# Patient Record
Sex: Female | Born: 1970
Health system: Southern US, Community
[De-identification: ages and names within clinical notes are randomized; demographics above are authoritative.]

## PROBLEM LIST (undated history)

## (undated) DIAGNOSIS — E221 Hyperprolactinemia: Secondary | ICD-10-CM

## (undated) DIAGNOSIS — R51 Headache: Secondary | ICD-10-CM

## (undated) DIAGNOSIS — G5603 Carpal tunnel syndrome, bilateral upper limbs: Secondary | ICD-10-CM

## (undated) DIAGNOSIS — F419 Anxiety disorder, unspecified: Secondary | ICD-10-CM

## (undated) HISTORY — DX: Anxiety disorder, unspecified: F41.9

## (undated) HISTORY — DX: Carpal tunnel syndrome, bilateral upper limbs: G56.03

## (undated) HISTORY — PX: NASAL SINUS SURGERY: SHX719

## (undated) HISTORY — DX: Hyperprolactinemia: E22.1

---

## 1999-11-03 ENCOUNTER — Other Ambulatory Visit: Admission: RE | Admit: 1999-11-03 | Discharge: 1999-11-03 | Payer: Self-pay | Admitting: Gynecology

## 2000-08-15 ENCOUNTER — Encounter: Admission: RE | Admit: 2000-08-15 | Discharge: 2000-08-15 | Payer: Self-pay | Admitting: Otolaryngology

## 2000-08-15 ENCOUNTER — Encounter: Payer: Self-pay | Admitting: Otolaryngology

## 2000-11-28 ENCOUNTER — Other Ambulatory Visit: Admission: RE | Admit: 2000-11-28 | Discharge: 2000-11-28 | Payer: Self-pay | Admitting: Gynecology

## 2000-12-19 ENCOUNTER — Encounter: Admission: RE | Admit: 2000-12-19 | Discharge: 2001-03-19 | Payer: Self-pay | Admitting: Gynecology

## 2001-02-28 ENCOUNTER — Other Ambulatory Visit: Admission: RE | Admit: 2001-02-28 | Discharge: 2001-02-28 | Payer: Self-pay | Admitting: *Deleted

## 2001-06-05 ENCOUNTER — Observation Stay (HOSPITAL_COMMUNITY): Admission: AD | Admit: 2001-06-05 | Discharge: 2001-06-06 | Payer: Self-pay | Admitting: Gynecology

## 2001-06-26 ENCOUNTER — Inpatient Hospital Stay (HOSPITAL_COMMUNITY): Admission: AD | Admit: 2001-06-26 | Discharge: 2001-06-28 | Payer: Self-pay | Admitting: Gynecology

## 2001-06-29 ENCOUNTER — Encounter: Admission: RE | Admit: 2001-06-29 | Discharge: 2001-07-29 | Payer: Self-pay | Admitting: Gynecology

## 2001-08-05 ENCOUNTER — Other Ambulatory Visit: Admission: RE | Admit: 2001-08-05 | Discharge: 2001-08-05 | Payer: Self-pay | Admitting: Gynecology

## 2002-09-25 ENCOUNTER — Other Ambulatory Visit: Admission: RE | Admit: 2002-09-25 | Discharge: 2002-09-25 | Payer: Self-pay | Admitting: Gynecology

## 2003-06-24 ENCOUNTER — Other Ambulatory Visit: Admission: RE | Admit: 2003-06-24 | Discharge: 2003-06-24 | Payer: Self-pay | Admitting: Internal Medicine

## 2004-01-10 ENCOUNTER — Inpatient Hospital Stay (HOSPITAL_COMMUNITY): Admission: AD | Admit: 2004-01-10 | Discharge: 2004-01-12 | Payer: Self-pay | Admitting: Gynecology

## 2004-02-18 ENCOUNTER — Other Ambulatory Visit: Admission: RE | Admit: 2004-02-18 | Discharge: 2004-02-18 | Payer: Self-pay | Admitting: Gynecology

## 2005-03-09 ENCOUNTER — Other Ambulatory Visit: Admission: RE | Admit: 2005-03-09 | Discharge: 2005-03-09 | Payer: Self-pay | Admitting: Gynecology

## 2005-08-02 ENCOUNTER — Other Ambulatory Visit: Admission: RE | Admit: 2005-08-02 | Discharge: 2005-08-02 | Payer: Self-pay | Admitting: Gynecology

## 2006-03-26 ENCOUNTER — Other Ambulatory Visit: Admission: RE | Admit: 2006-03-26 | Discharge: 2006-03-26 | Payer: Self-pay | Admitting: Gynecology

## 2007-03-31 ENCOUNTER — Other Ambulatory Visit: Admission: RE | Admit: 2007-03-31 | Discharge: 2007-03-31 | Payer: Self-pay | Admitting: Gynecology

## 2008-04-01 ENCOUNTER — Other Ambulatory Visit: Admission: RE | Admit: 2008-04-01 | Discharge: 2008-04-01 | Payer: Self-pay | Admitting: Gynecology

## 2008-08-23 ENCOUNTER — Ambulatory Visit: Payer: Self-pay | Admitting: Gynecology

## 2008-10-19 ENCOUNTER — Ambulatory Visit: Payer: Self-pay | Admitting: Gynecology

## 2008-10-25 ENCOUNTER — Ambulatory Visit: Payer: Self-pay | Admitting: Gynecology

## 2008-11-22 ENCOUNTER — Ambulatory Visit: Payer: Self-pay | Admitting: Gynecology

## 2009-09-28 ENCOUNTER — Encounter: Admission: RE | Admit: 2009-09-28 | Discharge: 2009-09-28 | Payer: Self-pay | Admitting: Obstetrics and Gynecology

## 2010-09-24 ENCOUNTER — Encounter: Payer: Self-pay | Admitting: Gynecology

## 2010-09-25 ENCOUNTER — Encounter: Payer: Self-pay | Admitting: Obstetrics and Gynecology

## 2011-01-19 NOTE — Discharge Summary (Signed)
[facility]  Patient:    Karina Ramirez, Karina Ramirez Visit Number: [id number] MRN: [id number]          Service Type: OBS Location: 910A 9147 01 Attending Physician:  Tonye Royalty Dictated by:   Antony Contras, [facility] Admit Date:  06/26/2001 Discharge Date: 06/28/2001                             Discharge Summary  DISCHARGE DIAGNOSES:          1. Intrauterine pregnancy at term.                               2. Spontaneous onset of labor.                               3. Decreased maternal expulsive efforts.  PROCEDURES:                   Low forceps-assisted vaginal delivery of viable infant over second-degree midline episiotomy.  HISTORY OF PRESENT ILLNESS:   The patient is a 40 year old primigravida with an LMP of September 18, 2000, [facility] June 25, 2001.  Prenatal course was complicated by early placenta previa, which did resolve.  PRENATAL LABORATORY DATA:     Blood type O positive, antibody screen negative. RPR, HBsAg, HIV nonreactive.  MSAFP normal.  GBS was also positive.  HOSPITAL COURSE:              The patient presented with spontaneous onset of labor and rupture of membranes at 40-1/2 weeks.  Cervix was 4 cm, completely effaced, -1 station.  The patient was given PCP prophylaxis and did progress to complete dilatation.  She did experience some difficulty with expulsive efforts in the second stage of labor and requested assistance.  Delivery was accomplished per Dr. Douglass Rivers with low forceps.  The patient was delivered of an Apgars of 9 and 9, 6-pound 15-ounce female infant over a second-degree midline episiotomy.  Findings included a short umbilical cord. Postpartum course:  The patient remained afebrile, had no difficulty voiding, and was able to be discharged on her second postpartum day in satisfactory condition.  CBC:  Hematocrit 28.4, hemoglobin 9.8, WBC 16.6, platelets 261.  FOLLOW-UP:                    The patient is to  follow up in the office.  MEDICATIONS:                  Continue with prenatal vitamins and iron. Motrin or Tylox for pain. Dictated by:   Antony Contras, [facility] Attending Physician:  Tonye Royalty DD:  07/11/01 TD:  07/13/01 Job: [id number]

## 2011-01-19 NOTE — H&P (Signed)
[facility]  Patient:    Karina Ramirez, Karina Ramirez Visit Number: [id number] MRN: [id number]          Service Type: OBS Location: 910B 9163 01 Attending Physician:  Tonye Royalty Dictated by:   Gaetano Hawthorne. Lily Peer, M.D. Admit Date:  06/26/2001                           History and Physical  CHIEF COMPLAINT:              Rupture of membranes and labor.  HISTORY:                      Patient is a 40 year old gravida 1, para 0, with a last menstrual period September 18, 2000, estimated date of confinement confirmed as June 25, 2001, currently 40-1/[redacted] weeks gestation, who presented to [facility] this morning complaining of rupture of membranes that started earlier this evening after intercourse and had a bloody show. She had gross rupture of membranes, positive Nitrazine, positive ferning.  In the emergency room at [facility], cervix was found to be 3-cm dilated, about 90% and -3 station, reassuring fetal heart tracing and very sporadic contractions.  Patients prenatal course is significant for the fact that early in her pregnancy, she had a marginal previa which spontaneously resolved.  She has had atypical squamous cells of undetermined significance in her Pap smear and was followed accordingly.  She had also suffered from migraines during her pregnancy and had gone to the [facility] and she also was treated for bacterial vaginosis during her pregnancy as well.  PAST MEDICAL HISTORY:         Patient with history of dysplasia at the age of 40 and treated with cryosurgery.  She has had a history of migraine headaches.  ALLERGIES:                    She has no allergies.  REVIEW OF SYSTEMS:            See [place] form.  PHYSICAL EXAMINATION:  VITAL SIGNS:                  Temperature 98.5, blood pressure 129/75, pulse 87.  HEENT:                        Unremarkable.  NECK:                         Supple.  Trachea midline.   No carotid bruits or thyromegaly.  LUNGS:                        Clear to auscultation without rhonchi or wheezes.  HEART:                        Regular rate and rhythm.  No murmurs or gallops.  BREASTS:                      Exam not done.  ABDOMEN:                      Gravid uterus, vertex presentation by [employer].  Positive fetal heart tones.  PELVIC:  Cervix 4 cm, complete effacement, -1 station.  EXTREMITIES:                  DTRs 1+.  Negative clonus.  PRENATAL LABORATORY DATA:     Blood type O-positive, negative antibody screen. VDRL was nonreactive.  Rubella immune.  Hepatitis B surface antigen and HIV were negative.  Alpha-fetoprotein was normal.  Diabetes screen was normal. GBS culture was positive.  Pap smear:  ASCUS.  ASSESSMENT:                   [age]-year-old gravida 1, para 0, at 40-1/[redacted] weeks gestation with spontaneous rupture of membranes around midnight tonight after intercourse, has advanced cervical dilatation, cervix 4 cm, complete effacement, -1 station with gross rupture of membranes and clear, irregular contractions.  PLAN:                         Pitocin augmentation.  Penicillin G per protocol secondary to positive GBS culture and epidural per patients request. Anticipate vaginal delivery. Dictated by:   Gaetano Hawthorne Lily Peer, M.D. Attending Physician:  Tonye Royalty DD:  06/26/01 TD:  06/26/01 Job: [id number]

## 2011-01-19 NOTE — H&P (Signed)
[facility]  Patient:    Karina Ramirez, Karina Ramirez Visit Number: [id number] MRN: [id number]          Service Type: Attending:  Douglass Rivers, M.D. Dictated by:   Douglass Rivers, M.D.                           History and Physical  HISTORY OF PRESENT ILLNESS:   The patient is a 40 year old, gravida 1 with an LMP of September 18, 2000, estimated date of confinement is June 25, 2001, estimated gestational age is 67 1/7 weeks whose pregnancy was complicated by a complete previa that was noted to be resolved by 22 weeks who presented today at the office for routine visit without any complaints. On pelvic examination, the patient was noted to be 1+ cm dilated. However, during the exam, we were noted to palpated an area that was soft and mushy. When the examining finger was removed, it was noted to be bloody. The patient was then transferred immediately for a transvaginal ultrasound. The ultrasound confirmed that the cervix indeed was 50% effaced. There was an echogenic mass seen in the cervix that was circular. There was some mild vascularity to it, perhaps, consistent with the area of concern during todays exam. An abdominal ultrasound was performed and noted the placenta had migrated posteriorly and was noted to be markedly free of this area that was adjacent to the cervix. There was noted to be very little blood on the vaginal probe at the end of the exam and very little blood noted when the patient wiped afterwards. However, she was then discharged home with precautions to stay on pelvic rest. The patient states that although she is no longer bleeding she is slightly uncomfortable with staying at home because of the bleeding that we had seen earlier in the office and would like to come in for observation. We felt that this was agreeable. Her pregnancy has been complicated also by an ASCUS Pap smear.  PRENATAL LABORATORY DATA:     She is O positive. Antibody  negative. RPR nonreactive. Rubella immune. Hepatitis B surface antigen, HIV nonreactive. GBS positive.  PAST OBSTETRICAL/GYNECOLOGICAL HISTORY:                      Significant only for cryo approximately 12 years ago.  PAST MEDICAL HISTORY:         Negative.  PAST SURGICAL HISTORY:        Negative.  FAMILY HISTORY:               Negative.  PHYSICAL EXAMINATION:  GENERAL:                      She is a well-appearing female in no acute distress.  ABDOMEN:                      Gravid with a fundal height of 36, soft, nontender. Vaginal exam is as noted above.  EXTREMITIES:                  Nontender.  PELVIC:                       Heart tones were auscultated. The infant is in a vertex presentation.  ASSESSMENT:                   Vaginal bleeding after  an ultrasound with a questionable mass noted in the cervix. Cannot with a 100% certainty rule out succenturiate lobe of the placenta. We will admit to her hospital for observation. If she continues to have vaginal bleeding, she is aware that we will probably need to section her. If, however, the bleeding stops, we also discussed the possibility of allowing her to labor. She is agreeable with both of these. If her bleeding is stable by the morning, we will discharge her home to remain on pelvic rest. Dictated by:   Douglass Rivers, M.D. Attending:  Douglass Rivers, M.D. DD:  06/05/01 TD:  06/05/01 Job: [id number]

## 2011-03-19 ENCOUNTER — Encounter (HOSPITAL_COMMUNITY): Payer: Self-pay | Admitting: *Deleted

## 2011-04-23 ENCOUNTER — Encounter (HOSPITAL_COMMUNITY): Payer: Self-pay | Admitting: *Deleted

## 2011-05-24 ENCOUNTER — Ambulatory Visit (HOSPITAL_COMMUNITY): Payer: BC Managed Care – PPO | Admitting: Anesthesiology

## 2011-05-24 ENCOUNTER — Encounter (HOSPITAL_COMMUNITY): Payer: Self-pay | Admitting: *Deleted

## 2011-05-24 ENCOUNTER — Other Ambulatory Visit: Payer: Self-pay | Admitting: Obstetrics and Gynecology

## 2011-05-24 ENCOUNTER — Encounter (HOSPITAL_COMMUNITY): Payer: Self-pay | Admitting: Anesthesiology

## 2011-05-24 ENCOUNTER — Encounter (HOSPITAL_COMMUNITY)
Admission: RE | Disposition: A | Discharge status: Home / Self Care | Payer: Self-pay | Source: Ambulatory Visit | Attending: Obstetrics and Gynecology

## 2011-05-24 ENCOUNTER — Ambulatory Visit (HOSPITAL_COMMUNITY)
Admission: RE | Admit: 2011-05-24 | Discharge: 2011-05-25 | Disposition: A | Discharge status: Home / Self Care | Payer: BC Managed Care – PPO | Source: Ambulatory Visit | Attending: Obstetrics and Gynecology | Admitting: Obstetrics and Gynecology

## 2011-05-24 DIAGNOSIS — N812 Incomplete uterovaginal prolapse: Secondary | ICD-10-CM | POA: Insufficient documentation

## 2011-05-24 DIAGNOSIS — N840 Polyp of corpus uteri: Secondary | ICD-10-CM | POA: Insufficient documentation

## 2011-05-24 DIAGNOSIS — N393 Stress incontinence (female) (male): Secondary | ICD-10-CM | POA: Insufficient documentation

## 2011-05-24 DIAGNOSIS — N920 Excessive and frequent menstruation with regular cycle: Secondary | ICD-10-CM | POA: Insufficient documentation

## 2011-05-24 HISTORY — PX: CYSTOCELE REPAIR: SHX163

## 2011-05-24 HISTORY — PX: BLADDER SUSPENSION: SHX72

## 2011-05-24 HISTORY — DX: Headache: R51

## 2011-05-24 HISTORY — PX: NOVASURE ABLATION: SHX5394

## 2011-05-24 LAB — URINE MICROSCOPIC-ADD ON

## 2011-05-24 LAB — URINALYSIS, ROUTINE W REFLEX MICROSCOPIC
Leukocytes, UA: NEGATIVE
Nitrite: NEGATIVE
Specific Gravity, Urine: 1.025 (ref 1.005–1.030)
Urobilinogen, UA: 0.2 mg/dL (ref 0.0–1.0)

## 2011-05-24 LAB — CBC
HCT: 39.5 % (ref 36.0–46.0)
RDW: 12.8 % (ref 11.5–15.5)
WBC: 7.3 10*3/uL (ref 4.0–10.5)

## 2011-05-24 LAB — PREGNANCY, URINE: Preg Test, Ur: NEGATIVE

## 2011-05-24 SURGERY — DILATATION & CURETTAGE/HYSTEROSCOPY WITH RESECTOCOPE
Anesthesia: General | Site: Vagina | Wound class: Clean Contaminated

## 2011-05-24 MED ORDER — DEXAMETHASONE SODIUM PHOSPHATE 10 MG/ML IJ SOLN
INTRAMUSCULAR | Status: AC
  Filled 2011-05-24: qty 1

## 2011-05-24 MED ORDER — LACTATED RINGERS IV SOLN
INTRAVENOUS | Status: DC
  Administered 2011-05-24 (×3): via INTRAVENOUS

## 2011-05-24 MED ORDER — FENTANYL CITRATE 0.05 MG/ML IJ SOLN: 1.0000 ug/kg | INTRAMUSCULAR | Status: DC | PRN

## 2011-05-24 MED ORDER — DOCUSATE SODIUM 100 MG PO CAPS
100.0000 mg | ORAL_CAPSULE | Freq: Every day | ORAL | Status: DC
  Administered 2011-05-24: 100 mg via ORAL
  Filled 2011-05-24: qty 1

## 2011-05-24 MED ORDER — OXYCODONE-ACETAMINOPHEN 5-325 MG PO TABS
1.0000 | ORAL_TABLET | ORAL | Status: DC | PRN
  Administered 2011-05-24 – 2011-05-25 (×2): 2 via ORAL
  Filled 2011-05-24 (×2): qty 2

## 2011-05-24 MED ORDER — GLYCOPYRROLATE 0.2 MG/ML IJ SOLN
INTRAMUSCULAR | Status: DC | PRN
  Administered 2011-05-24: 0.2 mg via INTRAVENOUS

## 2011-05-24 MED ORDER — CEFAZOLIN SODIUM 1-5 GM-% IV SOLN
1.0000 g | INTRAVENOUS | Status: AC
  Administered 2011-05-24: 1 g via INTRAVENOUS

## 2011-05-24 MED ORDER — METOCLOPRAMIDE HCL 5 MG/ML IJ SOLN
INTRAMUSCULAR | Status: AC
  Administered 2011-05-24: 10 mg via INTRAVENOUS
  Filled 2011-05-24: qty 2

## 2011-05-24 MED ORDER — KETOROLAC TROMETHAMINE 60 MG/2ML IM SOLN
INTRAMUSCULAR | Status: DC | PRN
  Administered 2011-05-24: 30 mg via INTRAMUSCULAR

## 2011-05-24 MED ORDER — ONDANSETRON HCL 4 MG/2ML IJ SOLN
INTRAMUSCULAR | Status: AC
  Filled 2011-05-24: qty 2

## 2011-05-24 MED ORDER — SUCCINYLCHOLINE CHLORIDE 20 MG/ML IJ SOLN
INTRAMUSCULAR | Status: DC | PRN
  Administered 2011-05-24: 60 mg via INTRAVENOUS
  Administered 2011-05-24: 40 mg via INTRAVENOUS

## 2011-05-24 MED ORDER — IBUPROFEN 600 MG PO TABS
600.0000 mg | ORAL_TABLET | Freq: Four times a day (QID) | ORAL | Status: DC | PRN
  Administered 2011-05-24: 600 mg via ORAL
  Filled 2011-05-24: qty 1

## 2011-05-24 MED ORDER — GLYCINE 1.5 % IR SOLN
Status: DC | PRN
  Administered 2011-05-24: 3000 mL

## 2011-05-24 MED ORDER — PROPOFOL 10 MG/ML IV EMUL
INTRAVENOUS | Status: AC
  Filled 2011-05-24: qty 20

## 2011-05-24 MED ORDER — ONDANSETRON HCL 4 MG/2ML IJ SOLN
INTRAMUSCULAR | Status: DC | PRN
  Administered 2011-05-24: 4 mg via INTRAVENOUS

## 2011-05-24 MED ORDER — FENTANYL CITRATE 0.05 MG/ML IJ SOLN
INTRAMUSCULAR | Status: AC
  Filled 2011-05-24: qty 2

## 2011-05-24 MED ORDER — GLYCOPYRROLATE 0.2 MG/ML IJ SOLN
INTRAMUSCULAR | Status: AC
  Filled 2011-05-24: qty 1

## 2011-05-24 MED ORDER — FENTANYL CITRATE 0.05 MG/ML IJ SOLN
INTRAMUSCULAR | Status: DC | PRN
  Administered 2011-05-24: 50 ug via INTRAVENOUS
  Administered 2011-05-24: 100 ug via INTRAVENOUS
  Administered 2011-05-24: 25 ug via INTRAVENOUS
  Administered 2011-05-24: 100 ug via INTRAVENOUS
  Administered 2011-05-24: 25 ug via INTRAVENOUS

## 2011-05-24 MED ORDER — CIPROFLOXACIN HCL 500 MG PO TABS: 250.0000 mg | ORAL_TABLET | Freq: Two times a day (BID) | ORAL | Status: AC

## 2011-05-24 MED ORDER — TEMAZEPAM 15 MG PO CAPS
15.0000 mg | ORAL_CAPSULE | Freq: Every evening | ORAL | Status: DC | PRN
  Administered 2011-05-25: 15 mg via ORAL
  Filled 2011-05-24: qty 1

## 2011-05-24 MED ORDER — ONDANSETRON HCL 4 MG PO TABS: 4.0000 mg | ORAL_TABLET | Freq: Four times a day (QID) | ORAL | Status: DC | PRN

## 2011-05-24 MED ORDER — LIDOCAINE HCL (CARDIAC) 20 MG/ML IV SOLN
INTRAVENOUS | Status: DC | PRN
  Administered 2011-05-24: 60 mg via INTRAVENOUS

## 2011-05-24 MED ORDER — OXYCODONE-ACETAMINOPHEN 5-325 MG PO TABS: 1.0000 | ORAL_TABLET | ORAL | Status: AC | PRN

## 2011-05-24 MED ORDER — LIDOCAINE HCL 1 % IJ SOLN
INTRAMUSCULAR | Status: DC | PRN
  Administered 2011-05-24: 10 mL

## 2011-05-24 MED ORDER — KETOROLAC TROMETHAMINE 60 MG/2ML IM SOLN
INTRAMUSCULAR | Status: AC
  Filled 2011-05-24: qty 2

## 2011-05-24 MED ORDER — PROPOFOL 10 MG/ML IV EMUL
INTRAVENOUS | Status: DC | PRN
  Administered 2011-05-24: 180 mg via INTRAVENOUS

## 2011-05-24 MED ORDER — ONDANSETRON HCL 4 MG/2ML IJ SOLN: 4.0000 mg | Freq: Four times a day (QID) | INTRAMUSCULAR | Status: DC | PRN

## 2011-05-24 MED ORDER — LACTATED RINGERS IV SOLN
INTRAVENOUS | Status: DC
  Administered 2011-05-24 – 2011-05-25 (×2): via INTRAVENOUS

## 2011-05-24 MED ORDER — DEXAMETHASONE SODIUM PHOSPHATE 10 MG/ML IJ SOLN
INTRAMUSCULAR | Status: DC | PRN
  Administered 2011-05-24: 10 mg via INTRAVENOUS

## 2011-05-24 MED ORDER — SUCCINYLCHOLINE CHLORIDE 20 MG/ML IJ SOLN
INTRAMUSCULAR | Status: AC
  Filled 2011-05-24: qty 1

## 2011-05-24 MED ORDER — INDIGOTINDISULFONATE SODIUM 8 MG/ML IJ SOLN
INTRAMUSCULAR | Status: DC | PRN
  Administered 2011-05-24: 5 mL via INTRAVENOUS

## 2011-05-24 MED ORDER — KETOROLAC TROMETHAMINE 30 MG/ML IJ SOLN
INTRAMUSCULAR | Status: DC | PRN
  Administered 2011-05-24: 30 mg via INTRAVENOUS

## 2011-05-24 MED ORDER — SIMETHICONE 80 MG PO CHEW: 80.0000 mg | CHEWABLE_TABLET | Freq: Four times a day (QID) | ORAL | Status: DC | PRN

## 2011-05-24 MED ORDER — MENTHOL 3 MG MT LOZG: 1.0000 | LOZENGE | OROMUCOSAL | Status: DC | PRN

## 2011-05-24 MED ORDER — LIDOCAINE-EPINEPHRINE 1 %-1:100000 IJ SOLN
INTRAMUSCULAR | Status: DC | PRN
  Administered 2011-05-24: 10 mL

## 2011-05-24 MED ORDER — METOCLOPRAMIDE HCL 5 MG/ML IJ SOLN
10.0000 mg | Freq: Once | INTRAMUSCULAR | Status: AC | PRN
  Administered 2011-05-24: 10 mg via INTRAVENOUS

## 2011-05-24 MED ORDER — MIDAZOLAM HCL 5 MG/5ML IJ SOLN
INTRAMUSCULAR | Status: DC | PRN
  Administered 2011-05-24: 2 mg via INTRAVENOUS

## 2011-05-24 MED ORDER — LIDOCAINE HCL (CARDIAC) 20 MG/ML IV SOLN
INTRAVENOUS | Status: AC
  Filled 2011-05-24: qty 5

## 2011-05-24 MED ORDER — STERILE WATER FOR IRRIGATION IR SOLN
Status: DC | PRN
  Administered 2011-05-24: 1000 mL

## 2011-05-24 SURGICAL SUPPLY — 44 items
ABLATOR ENDOMETRIAL BIPOLAR (ABLATOR) ×2 IMPLANT
BLADE SURG 11 STRL SS (BLADE) ×3 IMPLANT
CANISTER SUCTION 2500CC (MISCELLANEOUS) ×2 IMPLANT
CATH FOLEY 2WAY SLVR  5CC 18FR (CATHETERS) ×1
CATH FOLEY 2WAY SLVR 5CC 18FR (CATHETERS) ×1 IMPLANT
CATH ROBINSON RED A/P 16FR (CATHETERS) ×2 IMPLANT
CLOTH BEACON ORANGE TIMEOUT ST (SAFETY) ×2 IMPLANT
CONTAINER PREFILL 10% NBF 60ML (FORM) ×4 IMPLANT
DECANTER SPIKE VIAL GLASS SM (MISCELLANEOUS) IMPLANT
DERMABOND ADVANCED (GAUZE/BANDAGES/DRESSINGS) ×2 IMPLANT
DRAPE CAMERA CLOSED 9X96 (DRAPES) ×2 IMPLANT
DRAPE HYSTEROSCOPY (DRAPE) ×2 IMPLANT
DRAPE UTILITY XL STRL (DRAPES) ×4 IMPLANT
ELECT REM PT RETURN 9FT ADLT (ELECTROSURGICAL)
ELECTRODE REM PT RTRN 9FT ADLT (ELECTROSURGICAL) IMPLANT
ELECTRODE ROLLER BARREL 22FR (ELECTROSURGICAL) IMPLANT
ELECTRODE VAPORCUT 22FR (ELECTROSURGICAL) ×1 IMPLANT
GAUZE PACKING 2X5 YD STERILE (GAUZE/BANDAGES/DRESSINGS) IMPLANT
GLOVE BIO SURGEON STRL SZ 6.5 (GLOVE) ×4 IMPLANT
GOWN PREVENTION PLUS LG XLONG (DISPOSABLE) ×8 IMPLANT
LOOP ANGLED CUTTING 22FR (CUTTING LOOP) IMPLANT
NDL SPNL 20GX3.5 QUINCKE YW (NEEDLE) IMPLANT
NDL SPNL 22GX3.5 QUINCKE BK (NEEDLE) ×1 IMPLANT
NEEDLE HYPO 22GX1.5 SAFETY (NEEDLE) ×2 IMPLANT
NEEDLE SPNL 20GX3.5 QUINCKE YW (NEEDLE) IMPLANT
NEEDLE SPNL 22GX3.5 QUINCKE BK (NEEDLE) ×2 IMPLANT
NS IRRIG 1000ML POUR BTL (IV SOLUTION) ×2 IMPLANT
PACK HYSTEROSCOPY LF (CUSTOM PROCEDURE TRAY) ×2 IMPLANT
PACK VAGINAL MINOR WOMEN LF (CUSTOM PROCEDURE TRAY) ×2 IMPLANT
PACK VAGINAL WOMENS (CUSTOM PROCEDURE TRAY) ×2 IMPLANT
PLUG CATH AND CAP STER (CATHETERS) ×2 IMPLANT
SET CYSTO W/LG BORE CLAMP LF (SET/KITS/TRAYS/PACK) ×2 IMPLANT
SLING TVT EXACT (Sling) ×1 IMPLANT
SPONGE SURGIFOAM ABS GEL 12-7 (HEMOSTASIS) IMPLANT
SURGIFLO TRUKIT (HEMOSTASIS) IMPLANT
SUT VIC AB 0 CT1 27 (SUTURE)
SUT VIC AB 0 CT1 27XBRD ANBCTR (SUTURE) IMPLANT
SUT VIC AB 2-0 CT2 27 (SUTURE) ×1 IMPLANT
SUT VIC AB 2-0 SH 27 (SUTURE) ×2
SUT VIC AB 2-0 SH 27XBRD (SUTURE) ×2 IMPLANT
SYR CONTROL 10ML LL (SYRINGE) ×2 IMPLANT
TOWEL OR 17X24 6PK STRL BLUE (TOWEL DISPOSABLE) ×4 IMPLANT
TUBING CONNECTING 10 (TUBING) ×2 IMPLANT
WATER STERILE IRR 1000ML POUR (IV SOLUTION) IMPLANT

## 2011-05-24 NOTE — Anesthesia Preprocedure Evaluation (Addendum)
Anesthesia Evaluation  Name, MR# and DOB Patient awake  General Assessment Comment  Reviewed: Allergy & Precautions, H&P , NPO status , Patient's Chart, lab work & pertinent test results  Airway Mallampati: I TM Distance: >3 FB Neck ROM: full    Dental  (+) Teeth Intact   Pulmonary  clear to auscultation  pulmonary exam normalPulmonary Exam Normal breath sounds clear to auscultation none    Cardiovascular     Neuro/Psych Negative Psych ROS  GI/Hepatic/Renal negative GI ROS  negative Liver ROS  negative Renal ROS        Endo/Other  Negative Endocrine ROS (+)      Abdominal Normal abdominal exam  (+)   Musculoskeletal negative musculoskeletal ROS (+)   Hematology negative hematology ROS (+)   Peds  Reproductive/Obstetrics negative OB ROS    Anesthesia Other Findings             Anesthesia Physical Anesthesia Plan  ASA: I  Anesthesia Plan: General   Post-op Pain Management:    Induction: Intravenous  Airway Management Planned: LMA  Additional Equipment:   Intra-op Plan:   Post-operative Plan:   Informed Consent: I have reviewed the patients History and Physical, chart, labs and discussed the procedure including the risks, benefits and alternatives for the proposed anesthesia with the patient or authorized representative who has indicated his/her understanding and acceptance.     Plan Discussed with: CRNA  Anesthesia Plan Comments:         Anesthesia Quick Evaluation

## 2011-05-24 NOTE — H&P (Signed)
Karina Ramirez, Karina Ramirez                 ACCOUNT NO.:  [id number]  MEDICAL RECORD NO.:  [id number]  LOCATION:                                 FACILITY:  PHYSICIAN:  Randye Lobo, M.D.   DATE OF BIRTH:  August 08, 1971  DATE OF ADMISSION: DATE OF DISCHARGE:                             HISTORY & PHYSICAL   Surgery is scheduled for May 24, 2011, at 11:30.  CHIEF COMPLAINT:  Heavy and prolonged menses.  HISTORY OF PRESENT ILLNESS:  The patient is a 40 year old gravida 2, para 2 Caucasian female who presents reporting heavy and prolonged menses.  The patient has undergone a pelvic ultrasound in the office on March 21, 2011, which documented a possible 1.2-cm polyp in the endometrium.  The endometrial thickness measured 11.29 cm.  There was no evidence of any fibroids and the ovaries were unremarkable.  The patient underwent an endometrial biopsy on March 22, 2011, which documented benign secretory endometrium with no hyperplasia or malignancy.  The patient does request an endometrial ablation procedure.  The patient would also like concurrent treatment of urinary incontinence, which occurs with coughing, sneezing, laughing and exercise.  She denies any leakage of urine without warning.  PAST MEDICAL HISTORY: 1. Anxiety. 2. Environmental allergies. 3. History of hyperprolactinemia.  The patient is currently not on     medication.  Her prolactin level on May 18, 2011, was 27.7,     and a TSH at this time measured 1.936.  SURGERIES:  None.  PAST OB/GYN HISTORY:  The patient is status post vaginal delivery x2. The patient develops chloasma with the use of oral contraceptive pills.  MEDICATIONS:  Zyrtec, multivitamin, calcium, and vitamin D.  ALLERGIES:  ERYTHROMYCIN causes vomiting and SELDANE produces a reaction of which she is not certain.  TOLECTIN also produces a rash.  SOCIAL HISTORY:  The patient is married.  She works at [employer]. She has 2 children.  She denies  the use of tobacco or illicit drugs.  She drinks wine occasionally.  FAMILY HISTORY:  Positive for an aunt with breast cancer.  There is no family history of uterine, ovarian, or colon cancer.  PHYSICAL EXAM:  VITAL SIGNS:  Height 5 feet 3 inches, weight 136 pounds, blood pressure is 110/60. HEENT:  Normocephalic, atraumatic. NECK:  Negative for adenopathy and thyromegaly. LUNGS:  Clear to auscultation bilaterally. HEART:  S1, S2 with a regular rate and rhythm. ABDOMEN:  Soft and nontender and without evidence of hepatosplenomegaly or organomegaly. BREAST:  No evidence of dominant masses, skin retractions, nipple discharge, or axillary adenopathy. PELVIC:  Normal external genitalia and urethra.  The cervix demonstrates no lesions.  There is a first to second-degree cystocele.  There is no evidence of any uterine nor posterior vaginal wall prolapse.  The uterus is small and nontender.  No adnexal masses nor tenderness are appreciated.  With Valsalva maneuver, the patient demonstrates stress incontinence  IMPRESSION:  The patient is a 40 year old para 2 female with menometrorrhagia, and genuine stress incontinence, and incomplete vaginal prolapse.  PLAN:  The patient will proceed with a hysteroscopy with dilation and curettage, NovaSure endometrial ablation, TVT mid urethral sling and cystoscopy  along with an anticipated anterior colporrhaphy.  Risks, benefits, and alternatives have been reviewed with the patient who wishes to proceed.     Randye Lobo, M.D.     BES/MEDQ  D:  05/24/2011  T:  05/24/2011  Job:  [id number]

## 2011-05-24 NOTE — Anesthesia Postprocedure Evaluation (Signed)
Anesthesia Post Note  Patient: Karina Ramirez  Procedure(s) Performed:  DILATATION & CURETTAGE/HYSTEROSCOPY WITH RESECTOSCOPE; NOVASURE ABLATION; TRANSVAGINAL TAPE (TVT) PROCEDURE - with Cystoscopy; ANTERIOR REPAIR (CYSTOCELE)  Anesthesia type: General  Patient location: PACU  Post pain: Pain level controlled  Post assessment: Post-op Vital signs reviewed  Last Vitals:  Filed Vitals:   05/24/11 1458  BP:   Pulse:   Temp: 98.6 F (37 C)  Resp:     Post vital signs: Reviewed  Level of consciousness: sedated  Complications: No apparent anesthesia complicationsfj

## 2011-05-24 NOTE — Op Note (Signed)
Karina Ramirez, Karina Ramirez                 ACCOUNT NO.:  [id number]  MEDICAL RECORD NO.:  [id number]  LOCATION:  9303                          FACILITY:  [facility]  PHYSICIAN:  Randye Lobo, M.D.   DATE OF BIRTH:  December 31, 1970  DATE OF PROCEDURE:  05/24/2011 DATE OF DISCHARGE:                              OPERATIVE REPORT   PREOPERATIVE DIAGNOSES: 1. Menometrorrhagia. 2. Incomplete vaginal prolapse. 3. Genuine stress incontinence.  POSTOPERATIVE DIAGNOSES: 1. Menometrorrhagia. 2. Incomplete vaginal prolapse. 3. Genuine stress incontinence. 4. Endometrial polyp.  PROCEDURES:  Hysteroscopy, dilation and curettage, resection of endometrial polyp, NovaSure endometrial ablation, tension-free vaginal tape exact, mid urethral sling with cystoscopy, anterior colporrhaphy.  SURGEON:  Randye Lobo, MD  ASSISTANT:  Luvenia Redden, MD  ANESTHESIA:  General endotracheal, paracervical block with 1% lidocaine, vaginal, local with 1% lidocaine with epinephrine 1:100,000.  IV FLUIDS:  2000 mL Ringer's lactate.  ESTIMATED BLOOD LOSS:  150 mL.  URINE OUTPUT:  450 mL.  COMPLICATIONS:  None.  INDICATIONS FOR PROCEDURE:  The patient is a 40 year old, para 2 Caucasian female who presented with heavy and prolonged menses and an ultrasound documenting a possible 1.2-cm polyp.  The remainder of the ultrasound was unremarkable.  Endometrial biopsy in the office confirmed a benign secretory endometrium.  The patient reported urinary incontinence with coughing, sneezing, laughing and exercise.  On physical examination in the office, the patient was noted to have a first and second-degree cystocele.  She had no significant uterine nor posterior vaginal wall prolapse.  With Valsalva maneuver, the patient demonstrated stress incontinence.  The patient has requested surgical treatment of the abnormal bleeding and request an NovaSure endometrial ablation.  She would like to have concurrent treatment of her  prolapse and incontinence.  A plan is, therefore, made to proceed with a hysteroscopy with dilation and curettage, possible polypectomy, a NovaSure endometrial ablation, TVT, mid urethral sling with cystoscopy and an anterior colporrhaphy at the [facility] [place] on May 24, 2011.  Risks, benefits, and alternatives are reviewed with the patient who wishes to proceed.  FINDINGS: The patient had a second-degree cystocele noted.  She did have first- degree uterine prolapse.  She had a first-degree rectocele noted.  The uterine cavity demonstrated a 1-cm left cornual polyp.  There was no evidence of any fibroids.  Both of the tubal ostial regions were visualized.  Cystoscopy with placement of the sling documented the absence of a foreign body in the bladder of the urethra.  The bladder was visualized throughout 360 degrees including the bladder dome and trigone.  The trigone in the midline demonstrated what appeared to be a blood vessel that was lacking normal circulation over a few millimeters of distance. It appeared that there was a collateral circulation that developed around it.  A portion of this blood vessel that was evaluated and photographed did not contain any obvious red blood in it.  When indigo carmine dye was injected IV, it became a slightly pale blue color.  This was not noted throughout any other portion of the patient's bladder.   PROCEDURE:  The patient was reidentified in the preoperative hold area. She received Ancef IV  for antibiotic prophylaxis.  She received TED hose for DVT prophylaxis.  In the operating room, general endotracheal anesthesia was induced.  The patient was placed in the dorsal lithotomy position.  The abdomen and vagina were sterilely prepped and draped.  The patient was examined under anesthesia.  A Foley catheter was placed and the bladder was drained.  A speculum was placed inside the vagina and a single-tooth tenaculum  was placed on the anterior cervical lip.  A paracervical block was performed with a total of 10 mL of 1% lidocaine plain.  The cervical length was measured.  This was 3.3-cm to the internal os.  The uterus sounded to 8- cm.  The cervix was then dilated with Shawnie Pons dilators and the 2-mm diagnostic hysteroscope was inserted into the uterine cavity under the continuous infusion of lactated Ringer's solution.  The findings are as noted above.  An attempt was made to remove the polyp with a polyp forceps, but this was not possible.  The cervix was then further dilated to accommodate the resectoscope.  The resectoscope was then used to remove the endometrial polyp which was noted to be in the left cornual region. This was performed under the infusion of glycine solution.  After the polyp was removed, endometrial curettings were also performed and these were sent to pathology as 2 separate specimens.  The NovaSure device was then inserted into the uterine cavity.  The cavity width was measured at 3.0-cm.  The cavity length was set at 4.5- cm.  The CO2 test was performed and there was no evidence of breach of the uterine cavity.  The NovaSure device was then employed at a power setting of 74.  The NovaSure device was removed from within the uterine cavity.  The specimens were sent to pathology separately.  The endometrial cavity was lavaged at this time with lactated Ringer solution.  The resectoscope was introduced into the uterine cavity to examine the effect of the ablation and there was an area spared at the uterine fundus which was not treated with a NovaSure device.  The glycine was, therefore, readministered and the resectoscopic loop was used to ablate the endometrium at the fundus.  All of the hysteroscopic instruments were then removed at this time.  The sling and anterior colporrhaphy were performed next.  Allis clamps were used to mark the anterior vaginal wall beginning at  1-cm below the urethral meatus down to the level of the cervix.  The vaginal mucosa was injected locally with 1% lidocaine with epinephrine 1:100,000.  The vaginal mucosa was then incised in the midline with a scalpel.  Sharp and blunt dissection were used to dissect the vaginal mucosa off of the overlying subvaginal tissue and bladder.  The dissection was carried down to the level of the cervix and up to the level of the pubic rami.  A 1-cm suprapubic incisions were then created 2-cm to the right and left to the midline.  The TVT exact was performed in a down up fashion.  The Foley catheter with the rigid guide was placed in the urethra and deflected properly. The needle passer was placed first in the right-hand side.  The passer was placed to the endopelvic fascia through the space of Retzius and then up through the right suprapubic incision.  The same was performed on the patient's left-hand side after the urethra was deflected away from the insertion site.  Cystoscopy was performed.  The findings are as noted above.  The bladder was drained  of all cystoscopic fluid.  The abdominal needle passer was then drawn up through the suprapubic incisions bilaterally. A Kelly clamp was placed between the urethra and the sling as the plastic sheaths were removed suprapubically.  Excess sling was trimmed suprapubically.  The anterior colporrhaphy was performed at this time with vertical mattress sutures of 2-0 Vicryl.  Excess vaginal mucosa was trimmed and the anterior vaginal mucosa was closed with a running lock suture of 2-0 Vicryl.  The suprapubic incisions were closed with inverted subcuticular sutures of 3-0 Vicryl.  Dermabond was placed over the incisions.  The Foley catheter was removed at this time.  The patient was awakened and extubated and escorted to the recovery room in stable and awake condition.  There were no complications to the procedure.  All needle, instrument and sponge  counts were correct.  Randye Lobo, M.D.     BES/MEDQ  D:  05/24/2011  T:  05/24/2011  Job:  [id number]

## 2011-05-24 NOTE — Brief Op Note (Signed)
05/24/2011  2:09 PM  PATIENT:  Karina Ramirez  40 y.o. female  PRE-OPERATIVE DIAGNOSIS:  menometrorrhagia, endometrial mass, incomplete vaginal prolapse, genuine stress incontinence.  POST-OPERATIVE DIAGNOSIS:  menometrorrhagia, endometrial polyp, incomplete vaginal prolapse, genuine stress incontinence.  PROCEDURE:  Procedure(s): DILATATION & CURETTAGE/HYSTEROSCOPY WITH RESECTION OF ENDOMETRIAL POLYP NOVASURE ABLATION TRANSVAGINAL TAPE (TVT) PROCEDURE, CYSTOSCOPY ANTERIOR REPAIR (CYSTOCELE)  SURGEON:  Surgeon(s): Brook A Silva  PHYSICIAN ASSISTANT:   ASSISTANTS: Luvenia Redden  ANESTHESIA:   general  OR FLUID I/O:  Total I/O In: 2100 [I.V.:2100] Out: 600 [Urine:450; Blood:150] Fluid deficit for hysteroscopy - 150 cc  BLOOD ADMINISTERED:none  DRAINS: none   LOCAL MEDICATIONS USED:  LIDOCAINE  CC  SPECIMEN:  Source of Specimen:  Endometrial polyp sent separately from endometrial curettings  DISPOSITION OF SPECIMEN:  PATHOLOGY  COUNTS:  YES  TOURNIQUET:  * No tourniquets in log *  DICTATION: .Other Dictation: Dictation Number   PLAN OF CARE: Discharge to home after PACU  PATIENT DISPOSITION:  PACU - hemodynamically stable.   Delay start of Pharmacological VTE agent (>24hrs) due to surgical blood loss or risk of bleeding:  not applicable

## 2011-05-24 NOTE — Transfer of Care (Signed)
Immediate Anesthesia Transfer of Care Note  Patient: Karina Ramirez  Procedure(s) Performed:  DILATATION & CURETTAGE/HYSTEROSCOPY WITH RESECTOSCOPE; NOVASURE ABLATION; TRANSVAGINAL TAPE (TVT) PROCEDURE - with Cystoscopy; ANTERIOR REPAIR (CYSTOCELE)  Patient Location: PACU  Anesthesia Type: General  Level of Consciousness: awake, alert , oriented, patient cooperative and responds to stimulation  Airway & Oxygen Therapy: Patient Spontanous Breathing and Patient connected to nasal cannula oxygen  Post-op Assessment: Report given to PACU RN and Post -op Vital signs reviewed and stable  Post vital signs: Reviewed and stable  Complications: No apparent anesthesia complications

## 2011-05-24 NOTE — Progress Notes (Signed)
Dr. Edward Jolly notified of pt's desire to be admitted overnight with foley catheter.  Order received to insert foley and admit to women's unit.  Orders to follow.

## 2011-05-24 NOTE — Anesthesia Procedure Notes (Signed)
Procedure Name: LMA Insertion Date/Time: 05/24/2011 12:56 PM Performed by: Cephus Shelling Pre-anesthesia Checklist: Patient identified, Patient being monitored, Emergency Drugs available and Suction available Patient Re-evaluated:Patient Re-evaluated prior to inductionOxygen Delivery Method: Circle System Utilized Preoxygenation: Pre-oxygenation with 100% oxygen Intubation Type: Combination inhalational/ intravenous induction Ventilation: Mask ventilation without difficulty LMA: LMA inserted LMA Size: 4.0 Number of attempts: 1 Placement Confirmation: positive ETCO2 and breath sounds checked- equal and bilateral Dental Injury: Teeth and Oropharynx as per pre-operative assessment

## 2011-05-24 NOTE — Progress Notes (Signed)
Brief Pre-op Note  Ok to proceed with Hysteroscopy/Dilation and Curettage/Novasure Ablation/TVT with Cystoscopy and Anterior Colporrhaphy.

## 2011-05-24 NOTE — Progress Notes (Signed)
Dr. Edward Jolly notified of post-void residual(168 cc).  Dr. Edward Jolly will allow pt to go home today with bladder catheter/leg bag or be admitted with foley overnight to be reevaluated in the am.  Pt to decide and will let Dr. Edward Jolly know.

## 2011-05-25 LAB — CBC
HCT: 33.3 % — ABNORMAL LOW (ref 36.0–46.0)
Hemoglobin: 11 g/dL — ABNORMAL LOW (ref 12.0–15.0)
MCV: 89.5 fL (ref 78.0–100.0)
RBC: 3.72 MIL/uL — ABNORMAL LOW (ref 3.87–5.11)
WBC: 13 10*3/uL — ABNORMAL HIGH (ref 4.0–10.5)

## 2011-05-25 MED ORDER — OXYCODONE-ACETAMINOPHEN 5-325 MG PO TABS: 1.0000 | ORAL_TABLET | ORAL | Status: AC | PRN

## 2011-05-25 MED ORDER — DSS 100 MG PO CAPS: 100.0000 mg | ORAL_CAPSULE | Freq: Every day | ORAL | Status: AC

## 2011-05-25 MED ORDER — IBUPROFEN 600 MG PO TABS: 600.0000 mg | ORAL_TABLET | Freq: Four times a day (QID) | ORAL | Status: AC | PRN

## 2011-05-25 NOTE — Progress Notes (Signed)
Addendum  Voided 300 cc with PVR 30cc.  Will discharge to home without Foley.

## 2011-05-25 NOTE — Discharge Summary (Signed)
Physician Discharge Summary  Patient ID: RAFAEL QUESADA MRN: [id number] DOB/AGE: [birth year] 40 y.o.  Admit date: 05/24/2011 Discharge date: 05/25/2011  Admission Diagnoses:  Genuine stress incontinence, menometrorrhagia, incomplete vaginal prolapse  Discharge Diagnoses:  Genuine stress incontinence, menometrorrhagia, incomplete vaginal prolapse Active Problems:  * No active hospital problems. *    Discharged Condition: Good.  Hospital Course: Patient was observed overnight after placing a Foley catheter in the PACU.  She voided 300cc on post op day 1 with a residual volume of 32 cc.  Consults: none  Significant Diagnostic Studies:  none  Treatments: Hysteroscopy, Dilation and Curettage, Polypectomy, Novasure ablation, anterior colporrhaphy, TVT Exact sling, and cystoscopy on 05/24/11.  Discharge Exam: Blood pressure 108/68, pulse 70, temperature 98.1 F (36.7 C), temperature source Oral, resp. rate 18, height 5\' 3"  (1.6 m), weight 62.143 kg (137 lb), last menstrual period 04/13/2011, SpO2 97.00%. Suprapubic incisions intact.  Minimal vaginal bleeding.  Disposition: Home without foley catheter.   Current Discharge Medication List    START taking these medications   Details  ciprofloxacin (CIPRO) 500 MG tablet Take 0.5 tablets (250 mg total) by mouth 2 (two) times daily. Qty: 10 tablet, Refills: 0    oxyCODONE-acetaminophen (ROXICET) 5-325 MG per tablet Take 1-2 tablets by mouth every 4 (four) hours as needed for pain (Can be taken every 4 - 6 hours as needed for pain.). Qty: 30 tablet, Refills: 0      CONTINUE these medications which have NOT CHANGED   Details  Calcium-Vitamin D (CALTRATE 600 PLUS-VIT D PO) Take 1 tablet by mouth daily.      cetirizine (ZYRTEC) 10 MG tablet Take 10 mg by mouth daily.      Multiple Vitamins-Minerals (MULTIVITAMIN WITH MINERALS) tablet Take 1 tablet by mouth daily.         Follow-up Information    Follow up with SILVA,Bena Kobel A in 4  weeks. (See me in 3 days in the office if you are discharged with a Foley catheter.)    Contact information:   [address] [place] [address] [phone]          Signed: Melony Overly 05/25/2011, 9:37 AM

## 2011-05-25 NOTE — Progress Notes (Signed)
POD #1 - GYN  S- Patient admitted for observation overnight due to inability to void adequately in PACU.  Foley out now, no void attempt yet.   O- AVVS.  UO 3425 cc.  Abdomen - + bowel sounds, soft, nontender.  Incisions intact with Dermabond present and mild blood staining.   WBC 13.0, Hgb 11.0  A/P- Doing well post op.  Urinary retention in PACU.  - Voiding trial now.  - D/C home after voiding trial.  Will send home with foley if PVR over 100 cc.  - Already has RXs for Percocet and Ciprofloxacin for prophylaxis.  - F/U in 3 days if D/C'd with foley, otherwise F/U in 4 weeks.  - Instructions and precautions reviewed in detail with patient.  - I have reviewed surgical findings and procedure with patient.   Questions answered.

## 2011-05-31 ENCOUNTER — Encounter (HOSPITAL_COMMUNITY): Payer: Self-pay | Admitting: Obstetrics and Gynecology

## 2012-06-20 ENCOUNTER — Other Ambulatory Visit: Payer: Self-pay | Admitting: Obstetrics and Gynecology

## 2013-03-16 ENCOUNTER — Ambulatory Visit (INDEPENDENT_AMBULATORY_CARE_PROVIDER_SITE_OTHER): Payer: BC Managed Care – PPO | Admitting: Obstetrics and Gynecology

## 2013-03-16 ENCOUNTER — Encounter: Payer: Self-pay | Admitting: Obstetrics and Gynecology

## 2013-03-16 VITALS — BP 110/74 | HR 76 | Temp 98.4°F | Ht 62.5 in | Wt 142.0 lb

## 2013-03-16 DIAGNOSIS — B373 Candidiasis of vulva and vagina: Secondary | ICD-10-CM

## 2013-03-16 MED ORDER — NYSTATIN-TRIAMCINOLONE 100000-0.1 UNIT/GM-% EX CREA: TOPICAL_CREAM | Freq: Two times a day (BID) | CUTANEOUS | Status: DC

## 2013-03-16 MED ORDER — FLUCONAZOLE 150 MG PO TABS: 150.0000 mg | ORAL_TABLET | Freq: Once | ORAL | Status: DC

## 2013-03-16 NOTE — Progress Notes (Signed)
Patient ID: Karina Ramirez, female   DOB: 08-10-1971, 42 y.o.   MRN: [id number]  42 y.o.   Married    Caucasian   female    G2P2002   here for external vulvar irritation.  Some white discharge.  Very red.  Some more vaginal odor in general.   Uses Cetaphil and this maybe caused irritation. Used Terazol cream externally yesterday.  Helped a little bit.    Having nocturia 2 or more times a night.  No leakage if coughs or sneezes - maybe if jumps a lot.    No problems with rectal function.  Had endometrial ablation.  Has spotting and leg aching but not heavy bleeding.    Started Lexapro for anxiety.    Just started 20 mg three weeks ago.  Interested to see if this will work for her.  Questioning if this is the best option to treat her anxiety symptoms.  Wonders if her hormones are affecting how she feels.  No hot flashes.  Feels hotter in general.    Patient's last menstrual period was 03/10/2013.          Sexually active: yes  The current method of family planning is vasectomy.     Last mammogram:  8 months ago     Family History  Problem Relation Age of Onset  . Breast cancer Maternal Aunt     metastatic disease    There are no active problems to display for this patient.   Past Medical History  Diagnosis Date  . Headache(784.0)     not any longer  . Carpal tunnel syndrome, bilateral     Past Surgical History  Procedure Laterality Date  . Nasal sinus surgery      30 yrs ago  . Novasure ablation  05/24/2011    Procedure: NOVASURE ABLATION;  Surgeon: Melony Overly;  Location: [facility];  Service: Gynecology;  Laterality: N/A;  . Bladder suspension  05/24/2011    Procedure: TRANSVAGINAL TAPE (TVT) PROCEDURE;  Surgeon: Melony Overly;  Location: [facility];  Service: Gynecology;  Laterality: N/A;  with Cystoscopy  . Cystocele repair  05/24/2011    Procedure: ANTERIOR REPAIR (CYSTOCELE);  Surgeon: Melony Overly;  Location: [facility];  Service: Gynecology;  Laterality: N/A;    Allergies:  Erythromycin and Tolectin  Current Outpatient Prescriptions  Medication Sig Dispense Refill  . Calcium-Vitamin D (CALTRATE 600 PLUS-VIT D PO) Take 1 tablet by mouth daily.        Marland Kitchen escitalopram (LEXAPRO) 20 MG tablet Take 20 mg by mouth daily.      . fluticasone (FLONASE) 50 MCG/ACT nasal spray Place 2 sprays into the nose daily.      . Multiple Vitamins-Minerals (MULTIVITAMIN WITH MINERALS) tablet Take 1 tablet by mouth daily.        . NON FORMULARY TUMERIC 600 mg daily.      . Pyridoxine HCl (B-6) 100 MG TABS Take 2 tablets by mouth daily.       No current facility-administered medications for this visit.    ROS: Pertinent items are noted in HPI.  Social Hx:  Married.  Stay at home mom.  Exam:    BP 110/74  Pulse 76  Temp(Src) 98.4 F (36.9 C) (Oral)  Ht 5' 2.5" (1.588 m)  Wt 142 lb (64.411 kg)  BMI 25.54 kg/m2  LMP 03/10/2013   Wt Readings from Last 3 Encounters:  03/16/13 142 lb (64.411 kg)  05/24/11 137 lb (62.143 kg)  05/24/11 137 lb (62.143 kg)     Ht Readings from Last 3 Encounters:  03/16/13 5' 2.5" (1.588 m)  05/24/11 5\' 3"  (1.6 m)  05/24/11 5\' 3"  (1.6 m)    General appearance: alert, cooperative and appears stated age Right breast skin visualized medially and red 3 mm spots noted over a 3 cm area. Pelvic: External genitalia:  no lesions.  Suprapublic incisions without masses or tenderness.              Urethra:  normal appearing urethra with no masses, tenderness or lesions              Bartholins and Skenes: normal                 Vagina: normal appearing vagina with normal color and discharge, no lesions.  First degree rectocele.              Cervix: normal appearance                     Bimanual Exam:  Uterus:  uterus is normal size, shape, consistency and nontender                                      Adnexa: normal adnexa in size, nontender and no masses                                      Rectovaginal: Confirms                                       Anus:  normal sphincter tone, no lesions  Wet prep - PH 4.5, hyphae noted.  No trichomonas or  Clue cells.  Assessment  Yeast vulvovaginitis. Probable heat rash of breast skin. Anxiety  Plan  Diflucan Rx.  See EPIC orders. Mycolog II cream to vulva bid for 5 days. OK to use Mycolog on breast area as well. Will need re-evaluation if breast rash persists. Continue Lexapro.  Discussed with patient that it will take 4 - 6 weeks minimum to know if the dosage change will improve her symptoms. Annual exam for October 2014.   An After Visit Summary was printed and given to the patient.

## 2013-03-16 NOTE — Patient Instructions (Signed)
Monilial Vaginitis  Vaginitis in a soreness, swelling and redness (inflammation) of the vagina and vulva. Monilial vaginitis is not a sexually transmitted infection.  CAUSES   Yeast vaginitis is caused by yeast (candida) that is normally found in your vagina. With a yeast infection, the candida has overgrown in number to a point that upsets the chemical balance.  SYMPTOMS   · White, thick vaginal discharge.  · Swelling, itching, redness and irritation of the vagina and possibly the lips of the vagina (vulva).  · Burning or painful urination.  · Painful intercourse.  DIAGNOSIS   Things that may contribute to monilial vaginitis are:  · Postmenopausal and virginal states.  · Pregnancy.  · Infections.  · Being tired, sick or stressed, especially if you had monilial vaginitis in the past.  · Diabetes. Good control will help lower the chance.  · Birth control pills.  · Tight fitting garments.  · Using bubble bath, feminine sprays, douches or deodorant tampons.  · Taking certain medications that kill germs (antibiotics).  · Sporadic recurrence can occur if you become ill.  TREATMENT   Your caregiver will give you medication.  · There are several kinds of anti monilial vaginal creams and suppositories specific for monilial vaginitis. For recurrent yeast infections, use a suppository or cream in the vagina 2 times a week, or as directed.  · Anti-monilial or steroid cream for the itching or irritation of the vulva may also be used. Get your caregiver's permission.  · Painting the vagina with methylene blue solution may help if the monilial cream does not work.  · Eating yogurt may help prevent monilial vaginitis.  HOME CARE INSTRUCTIONS   · Finish all medication as prescribed.  · Do not have sex until treatment is completed or after your caregiver tells you it is okay.  · Take warm sitz baths.  · Do not douche.  · Do not use tampons, especially scented ones.  · Wear cotton underwear.  · Avoid tight pants and panty  hose.  · Tell your sexual partner that you have a yeast infection. They should go to their caregiver if they have symptoms such as mild rash or itching.  · Your sexual partner should be treated as well if your infection is difficult to eliminate.  · Practice safer sex. Use condoms.  · Some vaginal medications cause latex condoms to fail. Vaginal medications that harm condoms are:  · Cleocin cream.  · Butoconazole (Femstat®).  · Terconazole (Terazol®) vaginal suppository.  · Miconazole (Monistat®) (may be purchased over the counter).  SEEK MEDICAL CARE IF:   · You have a temperature by mouth above 102° F (38.9° C).  · The infection is getting worse after 2 days of treatment.  · The infection is not getting better after 3 days of treatment.  · You develop blisters in or around your vagina.  · You develop vaginal bleeding, and it is not your menstrual period.  · You have pain when you urinate.  · You develop intestinal problems.  · You have pain with sexual intercourse.  Document Released: 05/30/2005 Document Revised: 11/12/2011 Document Reviewed: 02/11/2009  [employer]® Patient Information ©2014 [employer].

## 2013-04-08 ENCOUNTER — Telehealth: Payer: Self-pay | Admitting: Obstetrics and Gynecology

## 2013-04-08 NOTE — Telephone Encounter (Signed)
Patient notified of Dr. Edward Jolly response of continuing Lexapro 20mg  at this time. Patient understands she will keep a diary or notes of how she does with this medication and her weight to  discuss with Dr. Edward Jolly at her AEX in October.

## 2013-04-08 NOTE — Telephone Encounter (Signed)
Patient wants to know if there something that she can take to help with her appetite?

## 2013-04-08 NOTE — Telephone Encounter (Signed)
Karina Ramirez,  I recommend that the patient continue with the Lexapro 20 mg at this time. Her physician just placed her on this dose not long ago, and she has not been on it long enough to determine if is working or not.   I would ask the patient to casually keep some notes about how she is doing on this dosage and medication,  Including her weight. This family of medications are used to treat premenstrual disorder, so I think it's fine from that standpoint.  When she comes back in October for her annual exam, we will have a good idea then if it needs to be changed.    Thanks,  [employer]

## 2013-04-08 NOTE — Telephone Encounter (Signed)
Last OV with Dr. Edward Jolly for yeast infection 03/16/2013. AEX scheduled in 06/2013 Patient calling to still inquire of Rx for Lexapro that was prescribed by her PCP, if this is the best option for her to be on for her anxiety plus have hormone issues also. Patient concern of how this might affect her appetite also. States she is unable to tell if this is a problem yet or if the Lexapro is helping. Please advise if patient needs to change to another medication you prefer she be on for anxiety and hormone issues.

## 2013-04-14 ENCOUNTER — Telehealth: Payer: Self-pay | Admitting: Obstetrics and Gynecology

## 2013-04-14 NOTE — Telephone Encounter (Signed)
Patient seen 03/16/2013 with Dr. Edward Jolly for yeast infection. Patient calling today with same symptoms as last visit. Prefers not to have to come in for this. Patient stated she did very well last month using the 2 diflucan as ordered and the use of mycolog cream also. Stated she felt much better until now as had itching, some white discharge  And redness of vaginal area. She has been using the cream like she did last time on the outside. Please advise on diflucan to be called in.

## 2013-04-14 NOTE — Telephone Encounter (Signed)
Patient was seen 03-16-2013 for yeast infection. Prescribed Diflucan and  a vaginal cream and now feels as though it has returned. Can you call something in or does she need to come in?

## 2013-04-14 NOTE — Telephone Encounter (Signed)
I prefer to have the patient come in for her vaginitis check.  We may have to come up with a regimen for chronic yeast infection if this is what is occurring.  Thank you.

## 2013-04-14 NOTE — Telephone Encounter (Signed)
Checked with D.Leonard concerning patient if needing Rx or OV.  Per Debbie patient needs to come in to be seen to check if yeast or bacterial. Patient notified of this and made appt. For tomorrow but request Dr. Edward Jolly know of this and see if Dr. Edward Jolly will call in Diflucan since she saw her last  Please advise.

## 2013-04-15 ENCOUNTER — Ambulatory Visit: Payer: BC Managed Care – PPO | Admitting: Certified Nurse Midwife

## 2013-04-15 NOTE — Telephone Encounter (Signed)
Patient needs to know if she needs to come in for her 2:00 appointment . She said she was waiting for a response since Dr. Edward Jolly wasn't here yesterday.

## 2013-04-15 NOTE — Telephone Encounter (Signed)
Patient notified of need to keep appointment concerning yeast infection today. Patient request to reschedule to see Dr. Edward Jolly for this. Patient rescheduled for Thursday , 12:45pm. 04/16/2013.

## 2013-04-16 ENCOUNTER — Encounter: Payer: Self-pay | Admitting: Certified Nurse Midwife

## 2013-04-16 ENCOUNTER — Ambulatory Visit (INDEPENDENT_AMBULATORY_CARE_PROVIDER_SITE_OTHER): Payer: BC Managed Care – PPO | Admitting: Certified Nurse Midwife

## 2013-04-16 VITALS — BP 100/64 | HR 72 | Resp 16 | Ht 62.5 in | Wt 147.0 lb

## 2013-04-16 DIAGNOSIS — N760 Acute vaginitis: Secondary | ICD-10-CM

## 2013-04-16 MED ORDER — METRONIDAZOLE 0.75 % VA GEL: 1.0000 | Freq: Every day | VAGINAL | Status: DC

## 2013-04-16 NOTE — Progress Notes (Signed)
42 y.o.MarriedCaucasian female [phone] with a 7 day(s) history of the following:burning, discharge described as white and curd-like and vulvar itching Sexually active: yes Last sexual activity:3days ago. Pt also reports the following associated symptoms: urinary urgency, not new symptom. Patient has been using Mycolog cream with some relief. Patient admits to hot tub use, and some irritation from that exposure. Patient feels she has gained weight recently and needs dietary suggestion to help with good diet. O: Healthy female WNWD Affect: normal orientation x 3    Exam:  WGN:FAOZHY, Bartholin's, Urethra, Skene's normal                Vag:no lesions, discharge: watery and malodorous, pH 5.5, wet prep done                Cx:  normal appearance and non tender                Uterus:normal size, non-tender, normal shape and consistency                Adnexa: normal adnexa and no mass, fullness, tenderness  Wet Prep shows:Positive BV, negative for yeast, trich   A: BV 2-Diet change   P: Reviewed findings of BV. Questions answered at length, regarding etiology of. Discussed ph change with hot tub use, and any new personal products. Avoid douching, but can do baking soda sitz bath for comfort and odor control. Rx Metrogel 2-Discussed protein intake with each meal to help with metabolism and hunger issues. Suggestions given with food sources.  Rv prn

## 2013-04-16 NOTE — Progress Notes (Signed)
Note reviewed, agree with plan.  Edit Ricciardelli, MD  

## 2013-04-28 ENCOUNTER — Telehealth: Payer: Self-pay | Admitting: Obstetrics and Gynecology

## 2013-04-28 NOTE — Telephone Encounter (Signed)
Spoke with pt about symptoms. Pt has been feeling overly bloated in abdomen, and hurts sometimes abdominally when she feels the urge to have a BM. States her PCP has her on lexapro and recently increased her dosage, and she has noticed her weight creeping up in the past 2 months. Today's weight was 151. Sched OV with BS on 04-30-13 at 2 pm.

## 2013-04-28 NOTE — Telephone Encounter (Signed)
Patient C/O break thru bleeding. July 4.  July 26. Aug 4. Aug 21. Panty Liner. Cramps.  Scale of 1-10. (5) .  Bloating .  IBU does help with the cramps.

## 2013-04-30 ENCOUNTER — Encounter: Payer: Self-pay | Admitting: Obstetrics and Gynecology

## 2013-04-30 ENCOUNTER — Ambulatory Visit (INDEPENDENT_AMBULATORY_CARE_PROVIDER_SITE_OTHER): Payer: BC Managed Care – PPO | Admitting: Obstetrics and Gynecology

## 2013-04-30 VITALS — BP 110/56 | HR 80 | Ht 62.5 in | Wt 147.5 lb

## 2013-04-30 DIAGNOSIS — N926 Irregular menstruation, unspecified: Secondary | ICD-10-CM

## 2013-04-30 DIAGNOSIS — N939 Abnormal uterine and vaginal bleeding, unspecified: Secondary | ICD-10-CM

## 2013-04-30 NOTE — Patient Instructions (Signed)
We will call you to schedule the ultrasound appointment.

## 2013-04-30 NOTE — Progress Notes (Signed)
Patient ID: Karina Ramirez, female   DOB: [birth year], 42 y.o.   MRN: [id number]  42 y.o.   Married    Caucasian   female   G2P2002   here for pelvic pain and bloating.  Patient here with several issues. 1. Concerned about weight gain.  On Lexapro 20 mg  Notes swelling in the left suprapubic area and wonders if it is due to weight gain.  2. Irregular bleeding.  History of Novasure 2012 done for heavy menstrual bleeding.   LMP 7/4 and then 7/8 - 7/10, 7/26, 7/31, 04/06/13, and 8/24 and 8/25.  Not clotting. Feel very bloated and soreness inside, but this is resolved.   Pain with menses can radiate down the legs.   3. Can have post coital bleeding. 4. Patient is concerned about her hormone levels. Feels like she has a "short fuse" Some warm flushes.   5. Doe not feel that Lexapro is helping. Used Zoloft and Effexor in the past which were not helpful.  Patient's mother takes Cymbalta and does well on this.  Seeing her PCP about this.  6. Saw an endocrinologist 4 months ago regarding a slight elevation of prolactin level.  Patient is not being treated for this.  Took Dostinex in the past.    Patient's last menstrual period was 04/26/2013.          Sexually active: yes  The current method of family planning is vasectomy.       Family History  Problem Relation Age of Onset  . Breast cancer Maternal Aunt     metastatic disease    There are no active problems to display for this patient.   Past Medical History  Diagnosis Date  . Headache(784.0)     not any longer  . Carpal tunnel syndrome, bilateral     Past Surgical History  Procedure Laterality Date  . Nasal sinus surgery      30 yrs ago  . Novasure ablation  05/24/2011    Procedure: NOVASURE ABLATION;  Surgeon: Melony Overly;  Location: [facility];  Service: Gynecology;  Laterality: N/A;  . Bladder suspension  05/24/2011    Procedure: TRANSVAGINAL TAPE (TVT) PROCEDURE;  Surgeon: Melony Overly;  Location: [facility];  Service: Gynecology;   Laterality: N/A;  with Cystoscopy  . Cystocele repair  05/24/2011    Procedure: ANTERIOR REPAIR (CYSTOCELE);  Surgeon: Melony Overly;  Location: [facility];  Service: Gynecology;  Laterality: N/A;    Allergies: Erythromycin and Tolectin  Current Outpatient Prescriptions  Medication Sig Dispense Refill  . Calcium-Vitamin D (CALTRATE 600 PLUS-VIT D PO) Take 1 tablet by mouth daily.        Marland Kitchen escitalopram (LEXAPRO) 20 MG tablet Take 20 mg by mouth daily.      . fluticasone (FLONASE) 50 MCG/ACT nasal spray Place 2 sprays into the nose daily.      . Multiple Vitamins-Minerals (MULTIVITAMIN WITH MINERALS) tablet Take 1 tablet by mouth daily.        . NON FORMULARY TUMERIC 600 mg daily.      . Pyridoxine HCl (B-6) 100 MG TABS Take 2 tablets by mouth daily.      . metroNIDAZOLE (METROGEL) 0.75 % vaginal gel Place 1 Applicatorful vaginally at bedtime. Daily at bedtime  70 g  0   No current facility-administered medications for this visit.   ROS: Pertinent items are noted in HPI.  Exam:    BP 110/56  Pulse 80  Ht 5' 2.5" (  1.588 m)  Wt 147 lb 8 oz (66.906 kg)  BMI 26.53 kg/m2  LMP 04/26/2013   Wt Readings from Last 3 Encounters:  04/30/13 147 lb 8 oz (66.906 kg)  04/16/13 147 lb (66.679 kg)  03/16/13 142 lb (64.411 kg)     Ht Readings from Last 3 Encounters:  04/30/13 5' 2.5" (1.588 m)  04/16/13 5' 2.5" (1.588 m)  03/16/13 5' 2.5" (1.588 m)    General appearance: alert, cooperative and appears stated age.  Tearful when talks about her weight.   Pelvic: External genitalia:  no lesions.  Has two suprapubic well healed incisions.               Urethra:  normal appearing urethra with no masses, tenderness or lesions              Bartholins and Skenes: normal                 Vagina: normal appearing vagina with normal color and discharge, no lesions              Cervix: normal appearance                  Bimanual Exam:  Uterus:  uterus is normal size, shape, consistency and nontender                                       Adnexa: normal adnexa in size, nontender and no masses                                    Assessment  Abnormal uterine bleeding. Status post Novasure ablation. Weight gain. Perimenopausal female. Depression. History of hyperprolactinemia - currently untreated.   Plan  TSH, prolactin, FSH, LH, estradiol. UPT. GC/CT from urine.  Patient agrees to this.  Return for pelvic ultrasound. Will consider an LoLoestrin after results are back from the above. May want to consider Cymbalta or Wellbutrin but will again wait until the results have returned from evaluation.    An After Visit Summary was printed and given to the patient.

## 2013-05-01 LAB — GC/CHLAMYDIA PROBE AMP, URINE
Chlamydia, Swab/Urine, PCR: NEGATIVE
GC Probe Amp, Urine: NEGATIVE

## 2013-05-01 LAB — FSH/LH: FSH: 6.3 m[IU]/mL

## 2013-05-01 LAB — ESTRADIOL: Estradiol: 109.6 pg/mL

## 2013-05-01 LAB — PROLACTIN: Prolactin: 22.6 ng/mL

## 2013-05-07 ENCOUNTER — Encounter: Payer: Self-pay | Admitting: Obstetrics and Gynecology

## 2013-05-07 ENCOUNTER — Ambulatory Visit (INDEPENDENT_AMBULATORY_CARE_PROVIDER_SITE_OTHER): Payer: BC Managed Care – PPO

## 2013-05-07 ENCOUNTER — Ambulatory Visit (INDEPENDENT_AMBULATORY_CARE_PROVIDER_SITE_OTHER): Payer: BC Managed Care – PPO | Admitting: Obstetrics and Gynecology

## 2013-05-07 VITALS — BP 110/64 | HR 70 | Ht 62.5 in | Wt 149.0 lb

## 2013-05-07 DIAGNOSIS — N926 Irregular menstruation, unspecified: Secondary | ICD-10-CM

## 2013-05-07 DIAGNOSIS — N921 Excessive and frequent menstruation with irregular cycle: Secondary | ICD-10-CM

## 2013-05-07 DIAGNOSIS — N760 Acute vaginitis: Secondary | ICD-10-CM

## 2013-05-07 DIAGNOSIS — N939 Abnormal uterine and vaginal bleeding, unspecified: Secondary | ICD-10-CM

## 2013-05-07 DIAGNOSIS — N762 Acute vulvitis: Secondary | ICD-10-CM

## 2013-05-07 NOTE — Progress Notes (Signed)
ADDENDUM TO U/S REPORT: Cx noted w/ echogenic focus 6 x 4 mm, cannot r/o cx polyp

## 2013-05-07 NOTE — Progress Notes (Signed)
Subjective  Here for ultrasound for abnormal uterine bleeding and postcoital bleeding. Patient is status post endometrial ablation several years ago.  Contraceptive method is vasectomy. Has history of hyperprolactinemia, and prolactin level is now normal. GC/CT from last visit normal. Estradiol, LH, FSH, TSH all normal.  Having external irritation issues of the vulva, which occurs chronically off and on.  No new exposures to irritants.  Objective  See ultrasound below.  Arcuate uterus.  EMS 4.71 mm, no uterine masses, ovaries WNL, left ovary with follicle 22 mm.    Pelvic exam  Vulva - no lesions.  Speculum exam - no cervical or vaginal lesions. Bimanual exam - Normal size and nontender uterus.  No adnexal masses or tenderness.  Wet prep - pH 4.5, negative for yeast, clue cells, and trichomonas.  Procedure - endometrial biopsy Consent performed. Speculum placed in vagina. Sterile prep of cervix with Hibiclens. Tenaculum to anterior cervical lip. Pipelle introduced to about 5 cm. Minimal tissue obtained.  No complications.   Assessment  Status post endometrial ablation. I suspect dysfunctional uterine bleeding.  Vulvitis.  Plan  Follow up EMB I gave 3 boxes of samples to the patient for LoLoestrin. Data for all - Lot number [id number].  Expiration May 2015.  I reviewed proper use, side effects, and complications of use.  Over the counter hydrocortisone cream 0.5 % to the vulva bid prn.   Follow up in 8 weeks for a recheck.

## 2013-05-07 NOTE — Patient Instructions (Signed)

## 2013-05-11 NOTE — Telephone Encounter (Signed)
Has questions regarding testing she recently had when she was here to see Dr. Edward Jolly on 05/07/13.

## 2013-05-11 NOTE — Telephone Encounter (Signed)
LMTCB 05/11/13 cm 

## 2013-05-19 NOTE — Telephone Encounter (Signed)
Patient spoke with Marchelle Folks regarding results on 05/11/13, please see result note.

## 2013-06-22 ENCOUNTER — Ambulatory Visit (INDEPENDENT_AMBULATORY_CARE_PROVIDER_SITE_OTHER): Payer: BC Managed Care – PPO | Admitting: Obstetrics and Gynecology

## 2013-06-22 ENCOUNTER — Encounter: Payer: Self-pay | Admitting: Obstetrics and Gynecology

## 2013-06-22 VITALS — BP 122/76 | HR 72 | Resp 16 | Ht 62.5 in | Wt 151.0 lb

## 2013-06-22 DIAGNOSIS — Z01419 Encounter for gynecological examination (general) (routine) without abnormal findings: Secondary | ICD-10-CM

## 2013-06-22 DIAGNOSIS — Z0000 Encounter for general adult medical examination without abnormal findings: Secondary | ICD-10-CM

## 2013-06-22 LAB — POCT URINALYSIS DIPSTICK
Bilirubin, UA: NEGATIVE
Ketones, UA: NEGATIVE
Leukocytes, UA: NEGATIVE
Protein, UA: NEGATIVE
pH, UA: 5

## 2013-06-22 MED ORDER — NORETHIN-ETH ESTRAD-FE BIPHAS 1 MG-10 MCG / 10 MCG PO TABS: 1.0000 | ORAL_TABLET | Freq: Every day | ORAL | Status: DC

## 2013-06-22 NOTE — Addendum Note (Signed)
Addended by: Conley Simmonds on: 06/22/2013 01:49 PM   Modules accepted: Orders

## 2013-06-22 NOTE — Progress Notes (Signed)
Patient ID: Karina Ramirez, female   DOB: 1971/06/13, 42 y.o.   MRN: [id number] GYNECOLOGY VISIT  PCP: Gibson Ramp, MD  Referring provider:   HPI: 42 y.o.   Married  Caucasian  female   G2P2002 with Patient's last menstrual period was 06/19/2013.   here for   AEX. Started LoLoestrin one month ago.   Feels better. Stopped Lexapro on her own.  Did not like weight gain that occurred with this. Doing OK so far.   A little bit of external irritation, last week around cycle time.  Nothing significant.   Seeing a chiropractor for carpal tunnel.  Hgb:  PCP Urine: Neg  GYNECOLOGIC HISTORY: Patient's last menstrual period was 06/19/2013. Sexually active:  yes Partner preference: female Contraception:  Ablation/OCP's--Loestrin  Menopausal hormone therapy: n/a DES exposure:   no Blood transfusions:  no  Sexually transmitted diseases:  no GYN Procedures:  Novasure 2012, TVT, Anterior colporrhaphy Mammogram:   06/2012 WUJ:[id number].  Will call to make appointment.          Pap:  05-18-11 wnl:no HR HPV done  History of abnormal pap smear:  20 years ago had cryotherapy to cervix for abnormal pap.  Paps Normal since.   OB History   Grav Para Term Preterm Abortions TAB SAB Ect Mult Living   2 2 2       2        LIFESTYLE: Exercise:     no          Tobacco:     no Alcohol:        socially Drug use:     no  OTHER HEALTH MAINTENANCE: Tetanus/TDap:  07/2012 Gardisil:  NA Influenza:   06-15-13 Zostavax:    n/a  Bone density:  n/a Colonoscopy:  n/a  Cholesterol check: 12/2011 wnl.  Patient will do with PCP.   Family History  Problem Relation Age of Onset  . Breast cancer Maternal Aunt     metastatic disease    There are no active problems to display for this patient.  Past Medical History  Diagnosis Date  . Headache(784.0)     not any longer  . Carpal tunnel syndrome, bilateral   . Hyperprolactinemia     Past Surgical History  Procedure Laterality Date  . Nasal sinus  surgery      30 yrs ago  . Novasure ablation  05/24/2011    Procedure: NOVASURE ABLATION;  Surgeon: Melony Overly;  Location: [facility];  Service: Gynecology;  Laterality: N/A;  . Bladder suspension  05/24/2011    Procedure: TRANSVAGINAL TAPE (TVT) PROCEDURE;  Surgeon: Melony Overly;  Location: [facility];  Service: Gynecology;  Laterality: N/A;  with Cystoscopy  . Cystocele repair  05/24/2011    Procedure: ANTERIOR REPAIR (CYSTOCELE);  Surgeon: Melony Overly;  Location: [facility];  Service: Gynecology;  Laterality: N/A;    ALLERGIES: Erythromycin and Tolectin  Current Outpatient Prescriptions  Medication Sig Dispense Refill  . Calcium-Vitamin D (CALTRATE 600 PLUS-VIT D PO) Take 1 tablet by mouth daily.        . fluticasone (FLONASE) 50 MCG/ACT nasal spray Place 2 sprays into the nose daily.      . Multiple Vitamins-Minerals (MULTIVITAMIN WITH MINERALS) tablet Take 1 tablet by mouth daily.        . NON FORMULARY TUMERIC 600 mg daily.      . Pyridoxine HCl (B-6) 100 MG TABS Take 2 tablets by mouth daily.      Marland Kitchen  escitalopram (LEXAPRO) 20 MG tablet Take 20 mg by mouth daily.       No current facility-administered medications for this visit.     ROS:  Pertinent items are noted in HPI.  SOCIAL HISTORY:  Married.    PHYSICAL EXAMINATION:    BP 122/76  Pulse 72  Resp 16  Ht 5' 2.5" (1.588 m)  Wt 151 lb (68.493 kg)  BMI 27.16 kg/m2  LMP 06/19/2013   Wt Readings from Last 3 Encounters:  06/22/13 151 lb (68.493 kg)  05/07/13 149 lb (67.586 kg)  04/30/13 147 lb 8 oz (66.906 kg)     Ht Readings from Last 3 Encounters:  06/22/13 5' 2.5" (1.588 m)  05/07/13 5' 2.5" (1.588 m)  04/30/13 5' 2.5" (1.588 m)    General appearance: alert, cooperative and appears stated age Head: Normocephalic, without obvious abnormality, atraumatic Neck: no adenopathy, supple, symmetrical, trachea midline and thyroid not enlarged, symmetric, no tenderness/mass/nodules Lungs: clear to auscultation  bilaterally Breasts: Inspection negative, No nipple retraction or dimpling, No nipple discharge or bleeding, No axillary or supraclavicular adenopathy, Normal to palpation without dominant masses Heart: regular rate and rhythm Abdomen: soft, non-tender; no masses,  no organomegaly Extremities: extremities normal, atraumatic, no cyanosis or edema Skin: Skin color, texture, turgor normal. No rashes or lesions Lymph nodes: Cervical, supraclavicular, and axillary nodes normal. No abnormal inguinal nodes palpated Neurologic: Grossly normal  Pelvic: External genitalia:  no lesions              Urethra:  normal appearing urethra with no masses, tenderness or lesions              Bartholins and Skenes: normal                 Vagina: normal appearing vagina with normal color and discharge, no lesions              Cervix: normal appearance              Pap and high risk HPV testing done: yes.            Bimanual Exam:  Uterus:  uterus is normal size, shape, consistency and nontender                                      Adnexa: normal adnexa in size, nontender and no masses                                      Rectovaginal: Confirms                                      Anus:  normal sphincter tone, no lesions  ASSESSMENT  Normal gynecologic exam. Chronic vulvitis symptoms.     PLAN  Mammogram due.  Patient will schedule at [facility].  Pap smear and high risk HPV testing.  Will see if pap shows anything. Labs with PCP.  LoLoestrin 10 month refill.  (Has 2 months worth.)  Medications per Epic orders Return annually or prn   An After Visit Summary was printed and given to the patient.

## 2013-06-22 NOTE — Patient Instructions (Signed)

## 2013-06-23 ENCOUNTER — Telehealth: Payer: Self-pay | Admitting: Emergency Medicine

## 2013-06-23 NOTE — Telephone Encounter (Signed)
If we need to change her OCP, I would recommend LoEstrin 1/20 Fe.  Thanks.

## 2013-06-23 NOTE — Telephone Encounter (Signed)
Patient is on Lo-LoLestrin and is costing her 35 dollars per month since it is a brand. She is wondering if you have any suggestions for a generic that may work the same. I offered savings card and patient is agreeable, she will also price out rx at [facility], [facility], [facility] etc.   If she cannot find a cheaper place for brand, what do you recommend?  Patient does not want to change right now but if we do change, she uses [facility].

## 2013-06-25 ENCOUNTER — Telehealth: Payer: Self-pay | Admitting: Obstetrics and Gynecology

## 2013-06-25 MED ORDER — NORETHINDRONE ACET-ETHINYL EST 1-20 MG-MCG PO TABS: 1.0000 | ORAL_TABLET | Freq: Every day | ORAL | Status: DC

## 2013-06-25 MED ORDER — NORETHIN ACE-ETH ESTRAD-FE 1-20 MG-MCG PO TABS: 1.0000 | ORAL_TABLET | Freq: Every day | ORAL | Status: DC

## 2013-06-25 NOTE — Telephone Encounter (Signed)
Patient called back, she has been unable to find cheaper option. Advised that Dr. Edward Jolly recommended LoEstrin 1/20 Fe. Patient is agreeable. Order entered and sent to pharmacy of patients choice.  Called pharmacy to clarify order as well.   Routing to provider for final review. Patient agreeable to disposition. Will close encounter

## 2013-06-25 NOTE — Telephone Encounter (Signed)
Baxter Hire calling to see how many refills on new LoEstrin Fe 1/20 Rx. Advised refills for 1 year.  Just has AEX 06-22-13 and is scheduled for Jun 23, 2014.  Routed to provider. Encounter closed.

## 2013-06-25 NOTE — Telephone Encounter (Signed)
Loestrin question from pharmacy for this patient please?

## 2013-06-26 ENCOUNTER — Other Ambulatory Visit: Payer: Self-pay | Admitting: Obstetrics and Gynecology

## 2013-06-26 DIAGNOSIS — R87610 Atypical squamous cells of undetermined significance on cytologic smear of cervix (ASC-US): Secondary | ICD-10-CM

## 2013-06-30 ENCOUNTER — Telehealth: Payer: Self-pay | Admitting: Orthopedic Surgery

## 2013-06-30 NOTE — Telephone Encounter (Signed)
Spoke with pt about Pap results and need for colposcopy. Procedure explained and questions answered. Pt reports she does not really have a cycle since her ablation. Pt advised she will not owe anything OOP per Carolynn's call to ins co. Pt able to come tomorrow for 9:15 appt with BS. Instructed pt to take 800 mg of ibuprofen an hour before the appt with food and fluids. Pt agreeable.

## 2013-07-01 ENCOUNTER — Ambulatory Visit: Payer: BC Managed Care – PPO | Admitting: Obstetrics and Gynecology

## 2013-07-09 ENCOUNTER — Encounter: Payer: Self-pay | Admitting: Obstetrics and Gynecology

## 2013-07-09 ENCOUNTER — Ambulatory Visit: Payer: Self-pay | Admitting: Obstetrics and Gynecology

## 2013-07-09 ENCOUNTER — Ambulatory Visit (INDEPENDENT_AMBULATORY_CARE_PROVIDER_SITE_OTHER): Payer: BC Managed Care – PPO | Admitting: Obstetrics and Gynecology

## 2013-07-09 ENCOUNTER — Ambulatory Visit: Payer: Self-pay

## 2013-07-09 VITALS — BP 130/70 | HR 70 | Ht 62.5 in | Wt 154.0 lb

## 2013-07-09 DIAGNOSIS — N763 Subacute and chronic vulvitis: Secondary | ICD-10-CM

## 2013-07-09 DIAGNOSIS — R87810 Cervical high risk human papillomavirus (HPV) DNA test positive: Secondary | ICD-10-CM

## 2013-07-09 DIAGNOSIS — N760 Acute vaginitis: Secondary | ICD-10-CM

## 2013-07-09 DIAGNOSIS — R87610 Atypical squamous cells of undetermined significance on cytologic smear of cervix (ASC-US): Secondary | ICD-10-CM

## 2013-07-09 DIAGNOSIS — R635 Abnormal weight gain: Secondary | ICD-10-CM

## 2013-07-09 MED ORDER — PHENTERMINE HCL 37.5 MG PO CAPS: 37.5000 mg | ORAL_CAPSULE | ORAL | Status: DC

## 2013-07-09 NOTE — Telephone Encounter (Signed)
Patient states since procedure has been having dysuria. No fevers, chills, vaginal discharge, vaginal bleeding or abdominal pain. I advised may have some irritation from procedure and to watch symptoms tonight and update office  in the morning if still having dysuria. Advised to rest and to go to urgent care tonight if develops any further symptoms. Patient is agreeable to plan.

## 2013-07-09 NOTE — Telephone Encounter (Signed)
Patient could not do 0730 due to having to drop children off at school. I placed her in the 0830 slot if that is okay with you for 12/4.

## 2013-07-09 NOTE — Telephone Encounter (Signed)
Have patient come on 08/06/13 at 7:30 for a brief visit with me.

## 2013-07-09 NOTE — Telephone Encounter (Signed)
Patient needs a 1 month reck, no appointments available.

## 2013-07-09 NOTE — Telephone Encounter (Signed)
Patient had a colposcopy completed this morning and is now experiencing a burning sensation when urinating. Patient wants to know if this is normal?

## 2013-07-09 NOTE — Telephone Encounter (Signed)
I agree with the plan. Patient may be having irritation from the acetic acid and the Monsel's solution. A bath to rinse the area will likely help.

## 2013-07-09 NOTE — Progress Notes (Signed)
Subjective:     Patient ID: Karina Ramirez, female   DOB: 1971-01-27, 42 y.o.   MRN: [id number]  HPI  Patient is here for colposcopy for ASCUS pap and positive high risk HPV.  History of prior abnormal pap and cryotherapy may years prior.  Patient with chronic vulvar irritation.  No vaginal discharge.  Irritation improved when uses steroid cream.  No vaginal discharge or odor. Shaves vulva and uses a cream for this.   Breast tenderness on OCPs.    Off Lexapro for a few weeks.  Would like to lose weight. Gained 10 pounds this summer.  Asking for Phentermine.  Used in the past.  Had a funny taste in her mouth but willing to use it for a short period of time.  Not exercising.  Did Weight Watchers in the past with success.  States she does not have time for this.    Review of Systems    See above.  ROS negative other than the symptoms above.  Objective:   Physical Exam  Genitourinary:         BP 130/70  P 70    Colposcopy - consent for procedure.   Speculum placed in vagina.  No discharge noted.   Acetic acid placed on cervix.  Colposcopy satisfactory.    ECC and biopsies of 3 and 9 o'clock sent to pathology.   Monsel's placed.  Speculum removed.  Vulvar with no erythema or excoriations. Acetic acid placed on vulva with gauze sponges.  No evidence of HPV noted.  No complications to procedure.     Assessment:     ASCUS pap and positive hight risk HPV. Chronic vulvitis. Weight gain.  Discontinuation of Lexapro.     Plan:     Await ECC and cervical biopsies.  I discussed at length with patient vulvar irritants and ways to avoid them. Phentermine 37.5 mg.  Discussed risks of pulmonary hypertension.  Patient has taken previously.  One month Rx.  See Epic. Needs follow up appointment in 1 month.  I did discuss need for lifestyle changes in order to have successful weight loss.  After visit summary to the patient.   20 minutes spent face to face with the patient of which  over 50% was spent in counseling.

## 2013-07-09 NOTE — Telephone Encounter (Signed)
Dr. Edward Jolly, patient needs a one month recheck. She was started on phentermine. No appointments available until 12/22. Should patient wait until that time or can have a nurse appointment to do weight and BP and patient can get refill around 12/6?

## 2013-07-09 NOTE — Patient Instructions (Signed)
Colposcopy Care After Colposcopy is a procedure in which a special tool is used to magnify the surface of the cervix. A tissue sample (biopsy) may also be taken. This sample will be looked at for cervical cancer or other problems. After the test:  You may have some cramping.  Lie down for a few minutes if you feel lightheaded.   You may have some bleeding which should stop in a few days. HOME CARE  Do not have sex or use tampons for 2 to 3 days or as told.  Only take medicine as told by your doctor.  Continue to take your birth control pills as usual. Finding out the results of your test Ask when your test results will be ready. Make sure you get your test results. GET HELP RIGHT AWAY IF:  You are bleeding a lot or are passing blood clots.  You develop a fever of 102 F (38.9 C) or higher.  You have abnormal vaginal discharge.  You have cramps that do not go away with medicine.  You feel lightheaded, dizzy, or pass out (faint). MAKE SURE YOU:   Understand these instructions.  Will watch your condition.  Will get help right away if you are not doing well or get worse. Document Released: 02/06/2008 Document Revised: 11/12/2011 Document Reviewed: 03/19/2013 [facility] Patient Information 2014 [place], [place].  Phentermine tablets or capsules What is this medicine? PHENTERMINE (FEN ter meen) decreases your appetite. It is used with a reduced calorie diet and exercise to help you lose weight. This medicine may be used for other purposes; ask your health care provider or pharmacist if you have questions. COMMON BRAND NAME(S): Adipex-P, Atti-Plex P , Atti-Plex P Spansule , Fastin, Pro-Fast, Tara-8  What should I tell my health care provider before I take this medicine? They need to know if you have any of these conditions: -agitation -glaucoma -heart disease -high blood pressure -history of substance abuse -lung disease called Primary Pulmonary Hypertension (PPH) -taken an  MAOI like Carbex, Eldepryl, Marplan, Nardil, or Parnate in last 14 days -thyroid disease -an unusual or allergic reaction to phentermine, other medicines, foods, dyes, or preservatives -pregnant or trying to get pregnant -breast-feeding How should I use this medicine? Take this medicine by mouth with a glass of water. Follow the directions on the prescription label. This medicine is usually taken 30 minutes before or 1 to 2 hours after breakfast. Avoid taking this medicine in the evening. It may interfere with sleep. Take your doses at regular intervals. Do not take your medicine more often than directed. Talk to your pediatrician regarding the use of this medicine in children. Special care may be needed. Overdosage: If you think you have taken too much of this medicine contact a poison control center or emergency room at once. NOTE: This medicine is only for you. Do not share this medicine with others. What if I miss a dose? If you miss a dose, take it as soon as you can. If it is almost time for your next dose, take only that dose. Do not take double or extra doses. What may interact with this medicine? Do not take this medicine with any of the following medications: -duloxetine -MAOIs like Carbex, Eldepryl, Marplan, Nardil, and Parnate -medicines for colds or breathing difficulties like pseudoephedrine or phenylephrine -procarbazine -sibutramine -SSRIs like citalopram, escitalopram, fluoxetine, fluvoxamine, paroxetine, and sertraline -stimulants like dexmethylphenidate, methylphenidate or modafinil -venlafaxine This medicine may also interact with the following medications: -medicines for diabetes This list may  not describe all possible interactions. Give your health care provider a list of all the medicines, herbs, non-prescription drugs, or dietary supplements you use. Also tell them if you smoke, drink alcohol, or use illegal drugs. Some items may interact with your medicine. What  should I watch for while using this medicine? Notify your physician immediately if you become short of breath while doing your normal activities. Do not take this medicine within 6 hours of bedtime. It can keep you from getting to sleep. Avoid drinks that contain caffeine and try to stick to a regular bedtime every night. This medicine was intended to be used in addition to a healthy diet and exercise. The best results are achieved this way. This medicine is only indicated for short-term use. Eventually your weight loss may level out. At that point, the drug will only help you maintain your new weight. Do not increase or in any way change your dose without consulting your doctor. You may get drowsy or dizzy. Do not drive, use machinery, or do anything that needs mental alertness until you know how this medicine affects you. Do not stand or sit up quickly, especially if you are an older patient. This reduces the risk of dizzy or fainting spells. Alcohol may increase dizziness and drowsiness. Avoid alcoholic drinks. What side effects may I notice from receiving this medicine? Side effects that you should report to your doctor or health care professional as soon as possible: -chest pain, palpitations -depression or severe changes in mood -increased blood pressure -irritability -nervousness or restlessness -severe dizziness -shortness of breath -problems urinating -unusual swelling of the legs -vomiting Side effects that usually do not require medical attention (report to your doctor or health care professional if they continue or are bothersome): -blurred vision or other eye problems -changes in sexual ability or desire -constipation or diarrhea -difficulty sleeping -dry mouth or unpleasant taste -headache -nausea This list may not describe all possible side effects. Call your doctor for medical advice about side effects. You may report side effects to FDA at 1-800-FDA-1088. Where should I  keep my medicine? Keep out of the reach of children. This medicine can be abused. Keep your medicine in a safe place to protect it from theft. Do not share this medicine with anyone. Selling or giving away this medicine is dangerous and against the law. Store at room temperature between 20 and 25 degrees C (68 and 77 degrees F). Keep container tightly closed. Throw away any unused medicine after the expiration date. NOTE: This sheet is a summary. It may not cover all possible information. If you have questions about this medicine, talk to your doctor, pharmacist, or health care provider.  2014, Elsevier/Gold Standard. (2010-10-04 11:02:44)

## 2013-07-16 ENCOUNTER — Telehealth: Payer: Self-pay | Admitting: *Deleted

## 2013-07-16 NOTE — Telephone Encounter (Signed)
Patient notified of results as directed by Dr Edward Jolly. Stressed importance of follow up in 6 months for repeat pap.  Appt scheduled for 01-11-14 and recall in for 01-30-14.

## 2013-07-16 NOTE — Telephone Encounter (Signed)
Message copied by Alisa Graff on Thu Jul 16, 2013 11:08 AM ------      Message from: Conley Simmonds      Created: Mon Jul 13, 2013  4:19 PM       Kennon Rounds,            Please report results of colpo biopsy to patient showing low grade dysplasia only.       No treatment is needed.       (Patient has a history of cryotherapy many years ago for dysplasia.)      I recommend a follow up pap in 6 months.      Please place in pap recall for 6 months.       ------

## 2013-08-03 ENCOUNTER — Encounter: Payer: Self-pay | Admitting: Obstetrics and Gynecology

## 2013-08-05 ENCOUNTER — Telehealth: Payer: Self-pay | Admitting: Obstetrics and Gynecology

## 2013-08-05 NOTE — Telephone Encounter (Signed)
Patient has appt tomorrow moring at 8:30 with Karina Ramirez but she wants to change the appt due to a conflict with getting her son to school.

## 2013-08-05 NOTE — Telephone Encounter (Signed)
Another date would be 12/11 at 3:00 pm.  I am unable to refill without an office visit with me.

## 2013-08-05 NOTE — Telephone Encounter (Signed)
Spoke with patient. She is agreeable to new visit time. Appointment scheduled.

## 2013-08-05 NOTE — Telephone Encounter (Signed)
Dr. Edward Jolly, patient is requesting to change her appointment for tomorrow. I advised of very tight schedule. She is coming for phentermine follow up. Can she come in for a nurse appointment later in the day or does she need an office visit with you? She feels she can wait if you think she can, but does not want to run out of medication.

## 2013-08-06 ENCOUNTER — Ambulatory Visit: Payer: Self-pay | Admitting: Obstetrics and Gynecology

## 2013-08-13 ENCOUNTER — Ambulatory Visit (INDEPENDENT_AMBULATORY_CARE_PROVIDER_SITE_OTHER): Payer: BC Managed Care – PPO | Admitting: Obstetrics and Gynecology

## 2013-08-13 ENCOUNTER — Encounter: Payer: Self-pay | Admitting: Obstetrics and Gynecology

## 2013-08-13 VITALS — BP 122/66 | HR 76 | Resp 16 | Ht 62.5 in | Wt 147.0 lb

## 2013-08-13 DIAGNOSIS — T50905A Adverse effect of unspecified drugs, medicaments and biological substances, initial encounter: Secondary | ICD-10-CM

## 2013-08-13 DIAGNOSIS — R635 Abnormal weight gain: Secondary | ICD-10-CM

## 2013-08-13 DIAGNOSIS — N6002 Solitary cyst of left breast: Secondary | ICD-10-CM

## 2013-08-13 DIAGNOSIS — N761 Subacute and chronic vaginitis: Secondary | ICD-10-CM

## 2013-08-13 DIAGNOSIS — N760 Acute vaginitis: Secondary | ICD-10-CM

## 2013-08-13 DIAGNOSIS — R6889 Other general symptoms and signs: Secondary | ICD-10-CM

## 2013-08-13 DIAGNOSIS — N6009 Solitary cyst of unspecified breast: Secondary | ICD-10-CM

## 2013-08-13 DIAGNOSIS — R638 Other symptoms and signs concerning food and fluid intake: Secondary | ICD-10-CM

## 2013-08-13 DIAGNOSIS — F411 Generalized anxiety disorder: Secondary | ICD-10-CM

## 2013-08-13 DIAGNOSIS — IMO0002 Reserved for concepts with insufficient information to code with codable children: Secondary | ICD-10-CM

## 2013-08-13 NOTE — Patient Instructions (Signed)
Please contact Karina Ramirez to establish care and receiving counseling for anxiety. You have her card to call and make an appointment.

## 2013-08-13 NOTE — Progress Notes (Signed)
Patient ID: Karina Ramirez, female   DOB: 04-02-1971, 42 y.o.   MRN: [id number]  Subjective  Patient presents today with multiple concerns.  1.  Patient reports vaginal itching and whitish vaginal discharge.  No odor.   Used Metrogel at home for 5 nights.  Feels a lot better.  Has a history of chronic vaginitis.   2.  Here for a weight check.  Stopped weight gain due to Lexapro.  Did not feel she needed this medication and did not like to weight gain.  Took phentermine 37.5 mg and developed anxiety.  Halved the dosage and still feels anxious. Not taking it every day.   Liked how it curbed her appetite. Lost 6 pounds so far.  Denies history of binge eating.  States she is too busy to exercise.  Focusing on her children and family activities.   3.  Asking about taking Xanax for anxiety.  Stopped Lexapro due the weight gain.  Family noting some changes since patient stopped this.  Has not done any counseling.  4.  Had colposcopy with biopsy showing LGSIL. Asking about hysterectomy to avoid cervical cancer.   5.  Had a mammogram showing a left breast abnormality.  Had a diagnostic left breast mammogram and ultrasound showing a probable benign cyst. Family history of aunt with breast cancer that started as a cyst that was being watched.  Patient worried about breast cancer risk and not wanting to wait 6 months for her next evaluation.   Objective  Weight 147 pounds. BP 122/66 P76  General - NAD.  Pelvic - normal external genitalia and urethra.  Vagina with gel present. Cervix without visible lesions. Uterus small and nontender.  No adnexal masses or tenderness.  Wet prep - pH 4.5, negative clue cells, yeast, and trichomonas.  Assessment  Vaginitis - chronic.  Improvement in symptoms recently status post Metrogel. Weight gain while on Lexapro. Intolerance to Phentermine. Anxiety.  LGSIL  Left breast cyst.   Plan  I reviewed exposures again with patient that may be causing  her chronic symptoms. Patient and I reviewed Alli as an option for weight loss assistance. I emphasized the importance of dietary modification and exercise as the cornerstones of successful weight loss. I have referred patient to Berniece Andreas by giving patient her card to call and make an appointment.  She will consider this for after the new year. I have not prescribed Xanax for the patient.  Patient will consider general surgery consultation for the breast cyst. I have also discussed that she could choose to have a repeat mammogram/ultrasound in 3 months instead of waiting 6 months.   25 minutes face to face time of which over 50% was spent in counseling.   After visit summary to the patient.

## 2013-08-14 DIAGNOSIS — N761 Subacute and chronic vaginitis: Secondary | ICD-10-CM | POA: Insufficient documentation

## 2013-08-25 ENCOUNTER — Encounter: Payer: Self-pay | Admitting: Nurse Practitioner

## 2013-08-25 ENCOUNTER — Ambulatory Visit (INDEPENDENT_AMBULATORY_CARE_PROVIDER_SITE_OTHER): Payer: BC Managed Care – PPO | Admitting: Nurse Practitioner

## 2013-08-25 VITALS — BP 126/80 | HR 60 | Ht 62.5 in | Wt 150.0 lb

## 2013-08-25 DIAGNOSIS — B373 Candidiasis of vulva and vagina: Secondary | ICD-10-CM

## 2013-08-25 MED ORDER — FLUCONAZOLE 150 MG PO TABS: ORAL_TABLET | ORAL | Status: DC

## 2013-08-25 MED ORDER — FLUCONAZOLE 150 MG PO TABS: 150.0000 mg | ORAL_TABLET | Freq: Once | ORAL | Status: DC

## 2013-08-25 NOTE — Patient Instructions (Signed)
Monilial Vaginitis  Vaginitis in a soreness, swelling and redness (inflammation) of the vagina and vulva. Monilial vaginitis is not a sexually transmitted infection.  CAUSES   Yeast vaginitis is caused by yeast (candida) that is normally found in your vagina. With a yeast infection, the candida has overgrown in number to a point that upsets the chemical balance.  SYMPTOMS   · White, thick vaginal discharge.  · Swelling, itching, redness and irritation of the vagina and possibly the lips of the vagina (vulva).  · Burning or painful urination.  · Painful intercourse.  DIAGNOSIS   Things that may contribute to monilial vaginitis are:  · Postmenopausal and virginal states.  · Pregnancy.  · Infections.  · Being tired, sick or stressed, especially if you had monilial vaginitis in the past.  · Diabetes. Good control will help lower the chance.  · Birth control pills.  · Tight fitting garments.  · Using bubble bath, feminine sprays, douches or deodorant tampons.  · Taking certain medications that kill germs (antibiotics).  · Sporadic recurrence can occur if you become ill.  TREATMENT   Your caregiver will give you medication.  · There are several kinds of anti monilial vaginal creams and suppositories specific for monilial vaginitis. For recurrent yeast infections, use a suppository or cream in the vagina 2 times a week, or as directed.  · Anti-monilial or steroid cream for the itching or irritation of the vulva may also be used. Get your caregiver's permission.  · Painting the vagina with methylene blue solution may help if the monilial cream does not work.  · Eating yogurt may help prevent monilial vaginitis.  HOME CARE INSTRUCTIONS   · Finish all medication as prescribed.  · Do not have sex until treatment is completed or after your caregiver tells you it is okay.  · Take warm sitz baths.  · Do not douche.  · Do not use tampons, especially scented ones.  · Wear cotton underwear.  · Avoid tight pants and panty  hose.  · Tell your sexual partner that you have a yeast infection. They should go to their caregiver if they have symptoms such as mild rash or itching.  · Your sexual partner should be treated as well if your infection is difficult to eliminate.  · Practice safer sex. Use condoms.  · Some vaginal medications cause latex condoms to fail. Vaginal medications that harm condoms are:  · Cleocin cream.  · Butoconazole (Femstat®).  · Terconazole (Terazol®) vaginal suppository.  · Miconazole (Monistat®) (may be purchased over the counter).  SEEK MEDICAL CARE IF:   · You have a temperature by mouth above 102° F (38.9° C).  · The infection is getting worse after 2 days of treatment.  · The infection is not getting better after 3 days of treatment.  · You develop blisters in or around your vagina.  · You develop vaginal bleeding, and it is not your menstrual period.  · You have pain when you urinate.  · You develop intestinal problems.  · You have pain with sexual intercourse.  Document Released: 05/30/2005 Document Revised: 11/12/2011 Document Reviewed: 02/11/2009  [employer]® Patient Information ©2014 [employer].

## 2013-08-25 NOTE — Progress Notes (Signed)
Subjective:     Patient ID: Karina Ramirez, female   DOB: 1971-05-20, 42 y.o.   MRN: [id number]  HPI  This 42 yo Fe presents with symptoms of vaginal infection with itching.  Most of the discharge is thick and white in color.  No urinary symptoms.  States she has a history of chronic yeast or bacterial infections.    Review of Systems  Constitutional: Negative for fever and chills.  Gastrointestinal: Negative.  Negative for nausea and vomiting.  Genitourinary: Positive for vaginal discharge. Negative for dysuria, urgency, frequency, flank pain, pelvic pain and dyspareunia.  Musculoskeletal: Negative.   Skin: Negative.   Psychiatric/Behavioral: Negative.        Objective:   Physical Exam  Constitutional: She is oriented to person, place, and time. She appears well-developed and well-nourished. No distress.  Abdominal: Soft. She exhibits no distension. There is no tenderness. There is no rebound and no guarding.  Genitourinary:  External vaginal irritation with erythema.  White thick vaginal discharge. Wet prep:  KOH + yeast; PH 4.0; NSS: negative  Neurological: She is alert and oriented to person, place, and time.  Psychiatric: She has a normal mood and affect. Her behavior is normal. Judgment and thought content normal.       Assessment:     Yeast vaginitis  History of acute and chronic vaginitis    Plan:     Diflucan 150 mg X 4  - instruction given to take tab today and follow in 3 days, then weekly for the remainder of 2 tables.  But if this does not work well for her may need to add Terazol vaginal cream Talked about vaginal health care and normal precautions Will have her to also get Pro B and take daily.

## 2013-08-26 ENCOUNTER — Telehealth: Payer: Self-pay | Admitting: Nurse Practitioner

## 2013-08-26 NOTE — Telephone Encounter (Signed)
Patient calling, requesting clarification for last two tablets of treatment.   Diflucan 150 mg X 4 - instruction given to take tab today and follow in 3 days, then weekly for the remainder of 2 tablets. Patient misplaced forms. Instructions given and patient verbalized understanding.   Routing to provider for final review. Patient agreeable to disposition. Will close encounter

## 2013-08-26 NOTE — Progress Notes (Signed)
Reviewed personally.  M. Suzanne Amauria Younts, MD.  

## 2013-08-26 NOTE — Telephone Encounter (Signed)
Pt saw PG yesterday and has some questions for the nurse

## 2013-11-27 ENCOUNTER — Telehealth: Payer: Self-pay | Admitting: Obstetrics and Gynecology

## 2013-11-27 NOTE — Telephone Encounter (Signed)
Spoke with patient and message from Dr. Edward JollySilva given. Patient will try otc treatment and call back on Monday for update. She declines to schedule a follow up appointment at this time.   Routing to provider for final review. Patient agreeable to disposition. Will close encounter

## 2013-11-27 NOTE — Telephone Encounter (Signed)
Spoke with patient. She has been having 5 days of vaginal itching and some white discharge that has resolved, with external redness and irritation on vulva. Patient feels this is yeast. She has a hx of chronic yeast vaginitis. Has not tried any otc treatment as patient states these do not work for her and she requires extended treatment with oral diflucan. Was last on treatment by Lauro FranklinPatricia Rolen-Grubb, FNP and given Diflucan x 4 on 08/25/14. Patient states she cannot come in for an office visit today. She feels she will be uncomfortable over the weekend and is requesting oral diflucan.  Advised patient that I would discuss with Dr. Edward JollySilva and call back with message from Dr. Edward JollySilva.  Patient agreeable.

## 2013-11-27 NOTE — Telephone Encounter (Signed)
Pt says she is having some issues. °

## 2013-11-27 NOTE — Telephone Encounter (Signed)
Karina Ramirez,  Please have the patient do over the counter Monistat GyneLotrimin or Monistat during the next couple of days, and then have her come in on Monday for me to re-evaluate.  She may need a different treatment type such as Terazol cream, for which I would need to see her.  Thanks and say hello to the patient for me!

## 2014-01-11 ENCOUNTER — Encounter: Payer: Self-pay | Admitting: Obstetrics and Gynecology

## 2014-01-11 ENCOUNTER — Ambulatory Visit (INDEPENDENT_AMBULATORY_CARE_PROVIDER_SITE_OTHER): Payer: BC Managed Care – PPO | Admitting: Obstetrics and Gynecology

## 2014-01-11 VITALS — BP 128/76 | HR 70 | Ht 62.5 in | Wt 147.0 lb

## 2014-01-11 DIAGNOSIS — IMO0002 Reserved for concepts with insufficient information to code with codable children: Secondary | ICD-10-CM

## 2014-01-11 DIAGNOSIS — R6889 Other general symptoms and signs: Secondary | ICD-10-CM

## 2014-01-11 NOTE — Patient Instructions (Signed)
I will see you in October for your annual examination.   Try the Acidophilus tablets daily to reduce yeast infections.

## 2014-01-11 NOTE — Progress Notes (Signed)
Patient ID: Karina Ramirez, female   DOB: Jan 06, 1971, 43 y.o.   MRN: [id number] GYNECOLOGY  VISIT   HPI: 43 y.o.   Married  Caucasian  female   G2P2002 with No LMP recorded. Patient has had an ablation.   here for repeat pap smear.    Pap showed ASCUS and positive HR HPV.   Colposcopy performed on 07/09/13 - LGSIL.  Feels like she is not emptying well.  Leaks right after voiding.  Does not occur all the time.  Drinking water more so may be happening more. No leak with cough or laugh. No leak with walking.  No leak with sneezing.  No leakage with running water.   GYNECOLOGIC HISTORY: No LMP recorded. Patient has had an ablation. Contraception:  Vasectomy/Junel fe Menopausal hormone therapy: none        OB History   Grav Para Term Preterm Abortions TAB SAB Ect Mult Living   2 2 2       2          Patient Active Problem List   Diagnosis Date Noted  . Chronic vaginitis 08/14/2013    Past Medical History  Diagnosis Date  . Headache(784.0)     not any longer  . Carpal tunnel syndrome, bilateral   . Hyperprolactinemia     Past Surgical History  Procedure Laterality Date  . Nasal sinus surgery      30 yrs ago  . Novasure ablation  05/24/2011    Procedure: NOVASURE ABLATION;  Surgeon: Melony OverlyBrook A Silva;  Location: [facility];  Service: Gynecology;  Laterality: N/A;  . Bladder suspension  05/24/2011    Procedure: TRANSVAGINAL TAPE (TVT) PROCEDURE;  Surgeon: Melony OverlyBrook A Silva;  Location: [facility];  Service: Gynecology;  Laterality: N/A;  with Cystoscopy  . Cystocele repair  05/24/2011    Procedure: ANTERIOR REPAIR (CYSTOCELE);  Surgeon: Melony OverlyBrook A Silva;  Location: [facility];  Service: Gynecology;  Laterality: N/A;    Current Outpatient Prescriptions  Medication Sig Dispense Refill  . Calcium-Vitamin D (CALTRATE 600 PLUS-VIT D PO) Take 1 tablet by mouth daily.        . fluticasone (FLONASE) 50 MCG/ACT nasal spray Place 2 sprays into the nose daily.      Marland Kitchen. loratadine (CLARITIN) 10 MG tablet  Take 10 mg by mouth daily.      . Multiple Vitamins-Minerals (MULTIVITAMIN WITH MINERALS) tablet Take 1 tablet by mouth daily.        . norethindrone-ethinyl estradiol (JUNEL FE,GILDESS FE,LOESTRIN FE) 1-20 MG-MCG tablet Take 1 tablet by mouth daily.  1 Package  10  . Pyridoxine HCl (B-6) 100 MG TABS Take 2 tablets by mouth daily.      Marland Kitchen. atenolol (TENORMIN) 25 MG tablet Take 25 mg by mouth daily.      . SUMAtriptan (IMITREX) 100 MG tablet Take 100 mg by mouth as needed.       No current facility-administered medications for this visit.     ALLERGIES: Erythromycin and Tolectin  Family History  Problem Relation Age of Onset  . Breast cancer Maternal Aunt     metastatic disease    History   Social History  . Marital Status: Married    Spouse Name: N/A    Number of Children: N/A  . Years of Education: N/A   Occupational History  . Not on file.   Social History Main Topics  . Smoking status: Never Smoker   . Smokeless tobacco: Never Used  . Alcohol Use: Yes  Comment: rarely wine  . Drug Use: No  . Sexual Activity: Yes    Partners: Male    Birth Control/ Protection: Surgical     Comment: husband had vasectomy   Other Topics Concern  . Not on file   Social History Narrative  . No narrative on file    ROS:  Pertinent items are noted in HPI.  PHYSICAL EXAMINATION:    BP 128/76  Pulse 70  Ht 5' 2.5" (1.588 m)  Wt 147 lb (66.679 kg)  BMI 26.44 kg/m2     General appearance: alert, cooperative and appears stated age   No abnormal inguinal nodes palpated  Pelvic: External genitalia:  no lesions              Urethra:  normal appearing urethra with no masses, tenderness or lesions              Bartholins and Skenes: normal                 Vagina: normal appearing vagina with normal color and discharge, no lesions              Cervix: normal appearance                   Bimanual Exam:  Uterus:  uterus is normal size, shape, consistency and nontender                                       Adnexa: normal adnexa in size, nontender and no masses                                       ASSESSMENT  LGSIL. Incomplete voiding? History of recurrent vaginitis.  PLAN  Pap and HR HPV testing today.  I discussed double voiding with patient as a way to reduce leakage. I anticipate next pap at annual exam in October 2015.  Acidophilus tables or daily probiotics to reduce infections.    An After Visit Summary was printed and given to the patient.  __25____ minutes face to face time of which over 50% was spent in counseling.

## 2014-01-12 LAB — IPS PAP TEST WITH HPV

## 2014-02-19 ENCOUNTER — Telehealth: Payer: Self-pay | Admitting: Obstetrics and Gynecology

## 2014-02-19 NOTE — Telephone Encounter (Signed)
Patient calling to speak with nurse with questions about: [employer], a Marine scientistfeminine hygiene product, and acidophilus dosages.  *Patient requests we not leave any details re: this call as she wants to talk directly with the nurse.

## 2014-02-19 NOTE — Telephone Encounter (Signed)
Spoke with patient. Patient states she was told to get acidophilus but when she went to the pharmacist she was told that there are different dosages. Advised that this is something that could be obtained OTC and I am not aware that it comes in different dosages but I would clarify with Dr.Silva. Patient would also like Dr.Silva to know "I am just not feeling clean and fresh down there. I do not have a fishy odor. It just doesn't smell fresh." Patient is asking about water works feminine hygiene product which is a form of douche. Patient has recurrent yeast infections and does not know what she should use. Advised that we do not recommend douching but that I would send a message over to Dr.Silva for further recommendations. Patient agreeable.

## 2014-02-19 NOTE — Telephone Encounter (Signed)
Spoke with patient. Advised of message as seen below from Dr.Silva. Patient is agreeable and verbalizes understanding. Patient would like to call back to schedule appointment with Dr.Silva as she is not at home and does not have her calendar. Patient will call back to schedule at a later date.  Routing to Tamar Lipscomb for final review. Patient agreeable to disposition. Will close encounter

## 2014-02-19 NOTE — Telephone Encounter (Signed)
Please have patient make an appointment to see me. We need to come up with a regimen for her to control her vaginitis symptoms that are recurrent. With respect to Acidophilus, follow the recommendations on the bottle.    Thanks!

## 2014-05-27 ENCOUNTER — Encounter: Payer: Self-pay | Admitting: Certified Nurse Midwife

## 2014-05-27 ENCOUNTER — Ambulatory Visit (INDEPENDENT_AMBULATORY_CARE_PROVIDER_SITE_OTHER): Payer: BC Managed Care – PPO | Admitting: Certified Nurse Midwife

## 2014-05-27 VITALS — BP 110/74 | HR 80 | Resp 14 | Ht 62.5 in | Wt 146.0 lb

## 2014-05-27 DIAGNOSIS — N760 Acute vaginitis: Secondary | ICD-10-CM

## 2014-05-27 MED ORDER — METRONIDAZOLE 0.75 % VA GEL: 1.0000 | Freq: Two times a day (BID) | VAGINAL | Status: DC

## 2014-05-27 NOTE — Progress Notes (Signed)
43 y.o. Married  Caucasian g2p2 here with complaint of vaginal symptoms of itching and odor. Has noted slight increase in discharge,Onset of symptoms off and on for past 6 months days ago. Denies new personal products or vaginal dryness. Previous bladder repair with no issues. Occasional spotting with ablation, but has not noticed this as a problem. No pain with sexual activity. No thong use. No STD concerns. Urinary symptoms occasional drop or two of leaking just occasional .   O:Healthy female WDWN Affect: normal, orientation x 3  Exam: Abdomen: soft  Non tender Lymph node: no enlargement or tenderness Pelvic exam: External genital: normal appearance no lesions BUS: negative, no leaking with cough Vagina: faint grey discharge noted. Ph: 4.5  ,Wet prep taken Cervix: normal, non tender Uterus: normal, non tender Adnexa:normal, non tender, no masses or fullness noted   Wet Prep results: positive for clue   A: BV  P:Discussed findings of BV and etiology. Discussed Aveeno or baking soda sitz bath for comfort. Avoid moist clothes or pads for extended period of time. Rx: Metrogel see orders.  Rv prn

## 2014-05-27 NOTE — Patient Instructions (Signed)
Bacterial Vaginosis Bacterial vaginosis is a vaginal infection that occurs when the normal balance of bacteria in the vagina is disrupted. It results from an overgrowth of certain bacteria. This is the most common vaginal infection in women of childbearing age. Treatment is important to prevent complications, especially in pregnant women, as it can cause a premature delivery. CAUSES  Bacterial vaginosis is caused by an increase in harmful bacteria that are normally present in smaller amounts in the vagina. Several different kinds of bacteria can cause bacterial vaginosis. However, the reason that the condition develops is not fully understood. RISK FACTORS Certain activities or behaviors can put you at an increased risk of developing bacterial vaginosis, including:  Having a new sex partner or multiple sex partners.  Douching.  Using an intrauterine device (IUD) for contraception. Women do not get bacterial vaginosis from toilet seats, bedding, swimming pools, or contact with objects around them. SIGNS AND SYMPTOMS  Some women with bacterial vaginosis have no signs or symptoms. Common symptoms include:  Grey vaginal discharge.  A fishlike odor with discharge, especially after sexual intercourse.  Itching or burning of the vagina and vulva.  Burning or pain with urination. DIAGNOSIS  Your health care provider will take a medical history and examine the vagina for signs of bacterial vaginosis. A sample of vaginal fluid may be taken. Your health care provider will look at this sample under a microscope to check for bacteria and abnormal cells. A vaginal pH test may also be done.  TREATMENT  Bacterial vaginosis may be treated with antibiotic medicines. These may be given in the form of a pill or a vaginal cream. A second round of antibiotics may be prescribed if the condition comes back after treatment.  HOME CARE INSTRUCTIONS   Only take over-the-counter or prescription medicines as  directed by your health care provider.  If antibiotic medicine was prescribed, take it as directed. Make sure you finish it even if you start to feel better.  Do not have sex until treatment is completed.  Tell all sexual partners that you have a vaginal infection. They should see their health care provider and be treated if they have problems, such as a mild rash or itching.  Practice safe sex by using condoms and only having one sex partner. SEEK MEDICAL CARE IF:   Your symptoms are not improving after 3 days of treatment.  You have increased discharge or pain.  You have a fever. MAKE SURE YOU:   Understand these instructions.  Will watch your condition.  Will get help right away if you are not doing well or get worse. FOR MORE INFORMATION  Centers for Disease Control and Prevention, Division of STD Prevention: www.cdc.gov/std American Sexual Health Association (ASHA): www.ashastd.org  Document Released: 08/20/2005 Document Revised: 06/10/2013 Document Reviewed: 04/01/2013 ExitCare Patient Information 2015 [employer]. This information is not intended to replace advice given to you by your health care provider. Make sure you discuss any questions you have with your health care provider.  

## 2014-05-28 NOTE — Progress Notes (Signed)
Reviewed personally.  M. Suzanne Tammra Pressman, MD.  

## 2014-06-23 ENCOUNTER — Encounter: Payer: Self-pay | Admitting: Obstetrics and Gynecology

## 2014-06-23 ENCOUNTER — Ambulatory Visit (INDEPENDENT_AMBULATORY_CARE_PROVIDER_SITE_OTHER): Payer: BC Managed Care – PPO | Admitting: Obstetrics and Gynecology

## 2014-06-23 ENCOUNTER — Telehealth: Payer: Self-pay | Admitting: Obstetrics and Gynecology

## 2014-06-23 VITALS — BP 110/76 | HR 64 | Resp 20 | Ht 62.5 in | Wt 147.4 lb

## 2014-06-23 DIAGNOSIS — Z0000 Encounter for general adult medical examination without abnormal findings: Secondary | ICD-10-CM

## 2014-06-23 DIAGNOSIS — N760 Acute vaginitis: Secondary | ICD-10-CM

## 2014-06-23 DIAGNOSIS — Z01419 Encounter for gynecological examination (general) (routine) without abnormal findings: Secondary | ICD-10-CM

## 2014-06-23 DIAGNOSIS — Z8639 Personal history of other endocrine, nutritional and metabolic disease: Secondary | ICD-10-CM

## 2014-06-23 LAB — POCT URINALYSIS DIPSTICK
Bilirubin, UA: NEGATIVE
Blood, UA: NEGATIVE
Glucose, UA: NEGATIVE
KETONES UA: NEGATIVE
Leukocytes, UA: NEGATIVE
NITRITE UA: NEGATIVE
PH UA: 5
PROTEIN UA: NEGATIVE
Urobilinogen, UA: NEGATIVE

## 2014-06-23 MED ORDER — NORETHIN ACE-ETH ESTRAD-FE 1-20 MG-MCG PO TABS: 1.0000 | ORAL_TABLET | Freq: Every day | ORAL | Status: DC

## 2014-06-23 MED ORDER — FLUCONAZOLE 150 MG PO TABS: 150.0000 mg | ORAL_TABLET | Freq: Once | ORAL | Status: DC

## 2014-06-23 MED ORDER — NORETHINDRONE 0.35 MG PO TABS: 1.0000 | ORAL_TABLET | Freq: Every day | ORAL | Status: DC

## 2014-06-23 MED ORDER — METRONIDAZOLE 500 MG PO TABS: 500.0000 mg | ORAL_TABLET | Freq: Two times a day (BID) | ORAL | Status: DC

## 2014-06-23 NOTE — Patient Instructions (Signed)

## 2014-06-23 NOTE — Telephone Encounter (Signed)
Patient spoke with her PCP and will remain on current blood pressure medication. Patient was told to call with this info.

## 2014-06-23 NOTE — Telephone Encounter (Signed)
Spoke with patient. Patient states that she saw her PCP and is going to stay on atenolol for her blood pressure. "I was told to call with this information so that Dr.Silva could see if I needed to stay on Junel Fe or switch to another birth control." Advised patient would send a message to Dr.Silva and return call with further recommendations. Patient is agreeable.

## 2014-06-23 NOTE — Progress Notes (Signed)
Patient ID: Karina Ramirez, female   DOB: Jun 01, 1971, 43 y.o.   MRN: [id number] 43 y.o. [id number] MarriedCaucasianF here for annual exam.   Busy with the kids.  Menses are very light - does not even wear a pad.  Taking Junel Fe.  Started this for cycle control. Spots for one day per month now.  History of endometrial ablation.  Had ultrasound 05/2013 - follicular cyst, otherwise remarkable.   Patient was treated for bacterial vaginosis for 10 days.  Still with odor.  Very concerned about vaginitis and odor.   Wants prolactin level checked.  History of elevated level.   Taking Atenolol for one year.  Started after last years visit.  Was having headaches and elevated blood pressure. Has appointment to see PCP today.   Patient's last menstrual period was 06/21/2014.          Sexually active: Yes.  female  The current method of family planning is vasectomy.    Exercising: No.  none. Smoker:  no  Health Maintenance: Pap:  01-11-14 wnl:neg HR HPV History of abnormal Pap:  Yes--06-22-13 ASCUS pap with Pos. HR HPV. Colposcopy 07-09-13 showed low grade dysplasia only.  No treatment necessary.  Repeat pap 01-11-14 wnl:neg HR HPV.  MMG:  07-09-13 heterogeneously dense/left breast showed 6 mm cyst at 4 o'clock on follow up ultrasound 07-14-13; otherwise normal:[facility].  Has appointment.  Colonoscopy: --- BMD:   --- TDaP:  07/2012 Screening Labs: PCP, Hb today: PCP, Urine today: Neg   reports that she has never smoked. She has never used smokeless tobacco. She reports that she drinks alcohol. She reports that she does not use illicit drugs.  Past Medical History  Diagnosis Date  . Headache(784.0)     not any longer  . Carpal tunnel syndrome, bilateral   . Hyperprolactinemia     Past Surgical History  Procedure Laterality Date  . Nasal sinus surgery      30 yrs ago  . Novasure ablation  05/24/2011    Procedure: NOVASURE ABLATION;  Surgeon: Melony OverlyBrook A Silva;  Location: [facility];  Service: Gynecology;   Laterality: N/A;  . Bladder suspension  05/24/2011    Procedure: TRANSVAGINAL TAPE (TVT) PROCEDURE;  Surgeon: Melony OverlyBrook A Silva;  Location: [facility];  Service: Gynecology;  Laterality: N/A;  with Cystoscopy  . Cystocele repair  05/24/2011    Procedure: ANTERIOR REPAIR (CYSTOCELE);  Surgeon: Melony OverlyBrook A Silva;  Location: [facility];  Service: Gynecology;  Laterality: N/A;    Current Outpatient Prescriptions  Medication Sig Dispense Refill  . atenolol (TENORMIN) 25 MG tablet Take 25 mg by mouth daily.      . Calcium-Vitamin D (CALTRATE 600 PLUS-VIT D PO) Take 1 tablet by mouth daily.        . celecoxib (CELEBREX) 200 MG capsule       . fluticasone (FLONASE) 50 MCG/ACT nasal spray Place 2 sprays into the nose daily.      Marland Kitchen. lactobacillus acidophilus (BACID) TABS tablet Take 1 tablet by mouth daily.      Marland Kitchen. loratadine (CLARITIN) 10 MG tablet Take 10 mg by mouth daily.      . Multiple Vitamins-Minerals (MULTIVITAMIN WITH MINERALS) tablet Take 1 tablet by mouth daily.        . norethindrone-ethinyl estradiol (JUNEL FE,GILDESS FE,LOESTRIN FE) 1-20 MG-MCG tablet Take 1 tablet by mouth daily.  1 Package  10  . Pyridoxine HCl (B-6) 100 MG TABS Take 2 tablets by mouth daily.      .Marland Kitchen  SUMAtriptan (IMITREX) 100 MG tablet Take 100 mg by mouth as needed.       No current facility-administered medications for this visit.    Family History  Problem Relation Age of Onset  . Breast cancer Maternal Aunt     metastatic disease    ROS:  Pertinent items are noted in HPI.  Otherwise, a comprehensive ROS was negative.  Exam:   BP 110/76  Pulse 64  Resp 20  Ht 5' 2.5" (1.588 m)  Wt 147 lb 6.4 oz (66.86 kg)  BMI 26.51 kg/m2  LMP 06/21/2014  Weight change: @WEIGHTCHANGE @ Height:   Height: 5' 2.5" (158.8 cm)  Ht Readings from Last 3 Encounters:  06/23/14 5' 2.5" (1.588 m)  05/27/14 5' 2.5" (1.588 m)  01/11/14 5' 2.5" (1.588 m)    General appearance: alert, cooperative and appears stated age Head: Normocephalic,  without obvious abnormality, atraumatic Neck: no adenopathy, supple, symmetrical, trachea midline and thyroid normal to inspection and palpation Lungs: clear to auscultation bilaterally Breasts: normal appearance, no masses or tenderness, Inspection negative, No nipple retraction or dimpling, No nipple discharge or bleeding, No axillary or supraclavicular adenopathy Heart: regular rate and rhythm Abdomen: soft, non-tender; bowel sounds normal; no masses,  no organomegaly Extremities: extremities normal, atraumatic, no cyanosis or edema Skin: Skin color, texture, turgor normal. No rashes or lesions Lymph nodes: Cervical, supraclavicular, and axillary nodes normal. No abnormal inguinal nodes palpated Neurologic: Grossly normal  Pelvic: External genitalia:  no lesions              Urethra:  normal appearing urethra with no masses, tenderness or lesions              Bartholins and Skenes: normal                 Vagina: normal appearing vagina with normal color and discharge, no lesions              Cervix: anteverted              Pap taken: Yes.   Bimanual Exam:  Uterus:  normal size, contour, position, consistency, mobility, non-tender              Adnexa: normal adnexa and no mass, fullness, tenderness               Rectovaginal: Confirms               Anus:  normal sphincter tone, no lesions  Wet prep - pH 4.0, negative clue cells, yeast and trichomonas.  A:  Well Woman with normal exam Hx of left breast cyst.  Status post endometrial ablation.  Vaginitis - subclinical BV. HTN.  On combined oral contraceptives. History of LGSIL. Hyperprolactinemia.  P:   Mammogram yearly.  Will get follow up report from ultrasound done this spring.  Has appointment for yearly mammogram.  pap smear and HPV testing.  OK for Flagyl 500 mg po bid for 7 days.  Prolactin level.  Patient will discuss with her PCP today whether or not he OKs the continued use of combined oral contraception.  May need to  switch to progesterone only OCP. return annually or prn  An After Visit Summary was printed and given to the patient.  Additional 10 minutes face to face time for vaginitis of which over 50% was spent in counseling.

## 2014-06-23 NOTE — Telephone Encounter (Signed)
Spoke with patient. Advised patient of message as seen below from Dr.Silva. Patient is agreeable to start Micronor at the end of current pack of birth control. Patient would like to wait to schedule 3 month recheck with Dr.Silva stating "I had a pap smear done and I want to wait for the results to see if I have to come back in. I had an abnormal one in the past and had to come back. That way I could have one appointment if I need to. If I don't have to when the results come back I will schedule the three month follow up." Rx for Micronor #3 0RF sent to [facility] pharmacy on file. Patient is agreeable and verbalizes understanding.  Routing to provider for final review. Patient agreeable to disposition. Will close encounter

## 2014-06-23 NOTE — Telephone Encounter (Signed)
The question was really if the PCP would OK her to continue of her combined oral contraceptive.  I recommend stopping her OCP and starting Micronor instead.  This may help her blood pressure.  She can take generic.  This needs to be taken every day and on time in order for her to have good cycle control and for it to function as "birth control." There are no sugar pills so she needs to take one every day.  Make the switch at the end of her current pack.  Please discuss with patient and make the change with her pharmacy.  I would like to see her for a 3 month recheck.

## 2014-06-23 NOTE — Telephone Encounter (Signed)
Left message to call Geral Coker at [phone]. 

## 2014-06-24 LAB — PROLACTIN: Prolactin: 23.8 ng/mL

## 2014-06-25 LAB — IPS PAP TEST WITH HPV

## 2014-07-05 ENCOUNTER — Encounter: Payer: Self-pay | Admitting: Obstetrics and Gynecology

## 2014-08-04 ENCOUNTER — Encounter: Payer: Self-pay | Admitting: Certified Nurse Midwife

## 2014-08-04 ENCOUNTER — Ambulatory Visit (INDEPENDENT_AMBULATORY_CARE_PROVIDER_SITE_OTHER): Payer: BC Managed Care – PPO | Admitting: Certified Nurse Midwife

## 2014-08-04 VITALS — BP 116/60 | HR 68 | Resp 12 | Ht 62.5 in | Wt 153.0 lb

## 2014-08-04 DIAGNOSIS — N898 Other specified noninflammatory disorders of vagina: Secondary | ICD-10-CM

## 2014-08-04 DIAGNOSIS — N9489 Other specified conditions associated with female genital organs and menstrual cycle: Secondary | ICD-10-CM

## 2014-08-04 NOTE — Progress Notes (Signed)
43 y.o. married white female g2p2002 here with complaint of vaginal symptoms of itching, burning, and increase discharge. Describes discharge as white and milky with odor..Onset of symptoms 7 days ago. Denies new personal products or vaginal dryness."Vaginal odor is not acceptable and notes this all the time". Patient treated for BV 06/06/14. Was seen for aex with Dr. Edward JollySilva, all appeared normal, but patient concerned about odor and was treated with Flagyl and Diflucan 2 weeks later. She had left over Metrogel and treated with it one week ago. Has changed contraception to [place]. ? Change in odor from use of new contraception.. Patient has occasional urinary incontinence, but does not note this smell. No STD concerns. Urinary symptoms none .   O:Healthy female WDWN Affect: normal, orientation x 3  Exam:  Lymph node: no enlargement or tenderness Pelvic exam: External genital: normal, no lesions BUS: negative Vagina: scant white nonodorous discharge noted. Ph: 4.0  Affirm taken, GC Chlamydia  Specimen taken Cervix: normal, non tender Uterus: normal, non tender Adnexa:normal, non tender, no masses or fullness noted  A:Vaginal odor, vaginal discharge Sporadic stress incontinence(  Not new) STD screen   P:Discussed findings of normal appearing vaginal discharge and no odor and etiology. Discussed Aveeno or baking soda sitz bath for odor control. Avoid moist clothes or pads for extended period of time. Labs: Affirm, GC, Chlamydia Will advise of results when in and treat if indicated.  Rv prn

## 2014-08-05 LAB — WET PREP BY MOLECULAR PROBE
Candida species: NEGATIVE
Gardnerella vaginalis: POSITIVE — AB
Trichomonas vaginosis: NEGATIVE

## 2014-08-06 ENCOUNTER — Telehealth: Payer: Self-pay | Admitting: Certified Nurse Midwife

## 2014-08-06 MED ORDER — CLINDAMYCIN PHOSPHATE 2 % VA CREA: 1.0000 | TOPICAL_CREAM | Freq: Every day | VAGINAL | Status: DC

## 2014-08-06 NOTE — Telephone Encounter (Signed)
Spoke with patient. Advised patient spoke with Verner Choleborah S. Leonard CNM. Advised yeast and trich are negative. Patient does have bacterial infection. Advised per Verner Choleborah S. Leonard CNM will need to start on Cleocin vaginal cream at bedtime for 5 days. Advised to perform Aveeno sitz bath nightly during treatment before applying cream. Advised not to treat with anything further after 5 days of treatment per Verner Choleborah S. Leonard CNM. Patient is agreeable and verbalizes understanding. Cleocin vaginal cream 0RF sent to pharmacy on file. Patient is agreeable.   Verner Choleborah S. Leonard CNM agree with plan?

## 2014-08-06 NOTE — Telephone Encounter (Signed)
Patient requesting results from 12/2. Karina Ramirez CNM please review and advise results from 12/2. Patient requesting return call today.

## 2014-08-06 NOTE — Telephone Encounter (Signed)
Patient is still waiting to here back from the nurse regarding her labs. Patient hope to here back from someone today.

## 2014-08-06 NOTE — Telephone Encounter (Signed)
Patient was told to call in 48 hours to give an update to Constellation EnergyDebbie Leonard's nurse.

## 2014-08-10 LAB — IPS N GONORRHOEA AND CHLAMYDIA BY PCR

## 2014-08-11 NOTE — Progress Notes (Signed)
Reviewed personally.  M. Suzanne Loany Neuroth, MD.  

## 2014-08-17 NOTE — Telephone Encounter (Signed)
Pt is still having symptoms of a bacterial infection and has questions for the nurse.

## 2014-08-17 NOTE — Telephone Encounter (Signed)
Spoke with patient. Patient states that she completed 7 days of Cleocin cream on 12/10. "I am still having an odor." Patient has appointment for recheck with Verner Choleborah S. Leonard CNM scheduled for 12/22 but states "I am worried if she has to do another culture that it will not be back for a couple of days and that will be around the holidays.I remember her saying that it may take one week after treatment to regain my balance. I just don't want to wait too long and then have to wait if I need anything." Appointment scheduled for 12/17 at 3:30pm with Verner Choleborah S. Leonard CNM. Patient is agreeable to date and time.   Routing to provider for final review. Patient agreeable to disposition. Will close encounter

## 2014-08-19 ENCOUNTER — Encounter: Payer: Self-pay | Admitting: Certified Nurse Midwife

## 2014-08-19 ENCOUNTER — Ambulatory Visit (INDEPENDENT_AMBULATORY_CARE_PROVIDER_SITE_OTHER): Payer: BC Managed Care – PPO | Admitting: Certified Nurse Midwife

## 2014-08-19 VITALS — BP 110/64 | HR 68 | Resp 16 | Ht 62.5 in | Wt 155.0 lb

## 2014-08-19 DIAGNOSIS — N898 Other specified noninflammatory disorders of vagina: Secondary | ICD-10-CM

## 2014-08-19 DIAGNOSIS — N9489 Other specified conditions associated with female genital organs and menstrual cycle: Secondary | ICD-10-CM

## 2014-08-19 NOTE — Progress Notes (Signed)
43 y.o. married white female g2p2002 here with complaint of vaginal symptoms of slight itching,no burning or increase discharge. Completed Cleocin one week ago for BV and felt it has resolved the discharge, but is still having odor at times   Denies new personal products or vaginal dryness. No STD concerns. Urinary symptoms none . " just wants to make sure all normal". No other issues today.   O:Healthy female WDWN Affect: normal, orientation x 3  Exam: Abdomen: non tender Lymph node: no enlargement or tenderness Pelvic exam: External genital:  BUS: negative Vagina: normal  discharge noted. Ph:3.5 ,Wet prep taken, negative whiff test Cervix: normal, non tender Uterus: normal, non tender Adnexa:normal, non tender, no masses or fullness noted   Wet Prep results:no pathogens noted   A:Normal pelvic exam Wet prep no pathogens noted   P:Discussed findings of negative wet prep.  Discussed can continue Aveeno or baking soda sitz bath for comfort if needed..Avoid any other products as her vagina returns to normal ph balance. Will be called with affirm results when in. Questions addressed. Rv prn

## 2014-08-20 NOTE — Progress Notes (Signed)
Reviewed personally.  M. Suzanne Lazarus [place], MD.  

## 2014-08-21 LAB — WET PREP BY MOLECULAR PROBE
CANDIDA SPECIES: NEGATIVE
Gardnerella vaginalis: NEGATIVE
TRICHOMONAS VAG: NEGATIVE

## 2014-08-24 ENCOUNTER — Ambulatory Visit: Payer: BC Managed Care – PPO | Admitting: Certified Nurse Midwife

## 2014-09-16 ENCOUNTER — Other Ambulatory Visit: Payer: Self-pay | Admitting: Orthopaedic Surgery

## 2014-09-16 DIAGNOSIS — M4712 Other spondylosis with myelopathy, cervical region: Secondary | ICD-10-CM

## 2014-09-22 ENCOUNTER — Ambulatory Visit
Admission: RE | Admit: 2014-09-22 | Discharge: 2014-09-22 | Disposition: A | Discharge status: Home / Self Care | Payer: BLUE CROSS/BLUE SHIELD | Source: Ambulatory Visit | Attending: Orthopaedic Surgery | Admitting: Orthopaedic Surgery

## 2014-09-22 DIAGNOSIS — M4712 Other spondylosis with myelopathy, cervical region: Secondary | ICD-10-CM

## 2014-09-29 ENCOUNTER — Inpatient Hospital Stay: Admission: RE | Admit: 2014-09-29 | Payer: Self-pay | Source: Ambulatory Visit

## 2014-10-04 ENCOUNTER — Other Ambulatory Visit: Payer: Self-pay | Admitting: Obstetrics and Gynecology

## 2014-10-04 NOTE — Telephone Encounter (Signed)
Patient is requesting a [facility] for [name] [address]

## 2014-10-04 NOTE — Telephone Encounter (Signed)
Medication refill request: Camila Last AEX:  06/23/14 Next AEX: 06/29/15 Last MMG (if hormonal medication request): 01/07/14 BIRADS0:Incomplete. 01/07/14 BIRADS3:Probably Benign  Refill authorized: 06/23/14 #3packs/0R. Today ?

## 2014-10-05 ENCOUNTER — Telehealth: Payer: Self-pay | Admitting: Obstetrics and Gynecology

## 2014-10-05 NOTE — Telephone Encounter (Signed)
Please contact the patient back regarding both her [name] and mammogram follow up.  The denial of the refill on her OCPs was not for a blood pressure check, it was because the patient is in mammogram hold and was [facility].  I did not see a report in EPIC.   Message to triage late yesterday after office hours to recheck the status of the mammogram follow up.   My apologies for any confusion.   CC - Myra Rudeeina Beltran

## 2014-10-05 NOTE — Telephone Encounter (Signed)
[facility] to [facility]. Patient has completed follow up. Imaging sent to pcp. Requested fax copies at this time.

## 2014-10-05 NOTE — Telephone Encounter (Addendum)
Patient states has been here 2 times after AEX and shows BP readings normal. She states she doesn't see the reason of a OCP follow up appt if the follow up is about her elevated BP. She states she doesn't have time the money for her copay.  Please advise.   Call back mobile #  Telephone note routed to Dr. Edward JollySilva from 10/05/14.

## 2014-10-05 NOTE — Telephone Encounter (Signed)
Imaging received. 3D Dx Bilateral Mammogram and L Breast Ultrasound completed 07/05/14. Impression: Benign, follow up in one year for screening. Copies to Dr. Rica RecordsSilva's office at this time.

## 2014-10-05 NOTE — Telephone Encounter (Signed)
Pt calling to get refill for her [name] birth control.

## 2014-10-05 NOTE — Telephone Encounter (Signed)
Patient states has been here 2 times after AEX and shows BP readings normal. She states she doesn't see the reason of a OCP follow up appt if the follow up is about her elevated BP. She states she doesn't have time and the money for her copay.  BP reading from 08/04/14 = 116/60. 08/19/14 = 110/64  Please advise.   Call back mobile #

## 2014-10-05 NOTE — Telephone Encounter (Signed)
LM for pt to call back. She needs Follow up appt for OCP refill. - Per Dr. Edward JollySilva. Refused Rx 10/04/14.

## 2014-10-07 MED ORDER — NORETHINDRONE 0.35 MG PO TABS: 1.0000 | ORAL_TABLET | Freq: Every day | ORAL | Status: DC

## 2014-10-07 NOTE — Telephone Encounter (Signed)
Call to patient home. Okay to leave detailed message per designated party release form on home number.  Message left to advise "The Prescription that you have requested was sent to your pharmacy if you have any questions,  return call to [facility] at [phone]."   Will close encounter.

## 2014-10-07 NOTE — Telephone Encounter (Signed)
OK to refill Micronor OCPs until annual exam and remove from mammogram hold.  Next mammogram is routine screening.

## 2014-12-24 ENCOUNTER — Other Ambulatory Visit: Payer: Self-pay | Admitting: Obstetrics and Gynecology

## 2014-12-24 NOTE — Telephone Encounter (Signed)
10/07/14 #3 packs/[facility] rfs [facility] [address]- Denied.

## 2014-12-29 ENCOUNTER — Telehealth: Payer: Self-pay | Admitting: Obstetrics and Gynecology

## 2014-12-29 NOTE — Telephone Encounter (Signed)
Patient is confused about her most recent birth control prescription. Patient is not sure if she was supposed to get Camila or Junel? Last seen 08/19/14.

## 2014-12-29 NOTE — Telephone Encounter (Signed)
Spoke with patient. Patient states that she wants to make sure that her birth control on file is listed as Karina Ramirez. Advised patient per notes and last refill current POP is Karina Ramirez. Patient is agreeable. Has picked up rx from pharmacy that was for Karina Ramirez. "I just could not remember what [place] told Debbi [place]. I wanted to make sure it was right in my chart."   Routing to provider for final review. Patient agreeable to disposition. Will close encounter

## 2015-01-17 ENCOUNTER — Telehealth: Payer: Self-pay | Admitting: Certified Nurse Midwife

## 2015-01-17 NOTE — Telephone Encounter (Signed)
Unsure what message means

## 2015-01-17 NOTE — Telephone Encounter (Signed)
Pt called to cancel appt for 5/17 with D. Darcel BayleyLeonard. Pt states she questions if it is a bacterial infection and wants to get it time.

## 2015-01-18 ENCOUNTER — Ambulatory Visit: Payer: Self-pay | Admitting: Certified Nurse Midwife

## 2015-01-18 NOTE — Telephone Encounter (Signed)
Pt was scheduled for appointment 01/18/15 for bacterial infection. Pt called 01/17/15 to cancel this appointment. Pt states she would like to see if it will resolve on its own.

## 2015-01-18 NOTE — Telephone Encounter (Signed)
Routing to Verner Choleborah S. Leonard CNM as Lorain ChildesFYI.  Routing to provider for final review. Patient agreeable to disposition. Will close encounter.   Patient aware provider will review message and nurse will return call if any additional advice or change of disposition.

## 2015-02-02 ENCOUNTER — Ambulatory Visit (INDEPENDENT_AMBULATORY_CARE_PROVIDER_SITE_OTHER): Payer: BLUE CROSS/BLUE SHIELD | Admitting: Certified Nurse Midwife

## 2015-02-02 ENCOUNTER — Encounter: Payer: Self-pay | Admitting: Certified Nurse Midwife

## 2015-02-02 VITALS — BP 120/70 | HR 68 | Resp 16 | Ht 62.5 in | Wt 148.0 lb

## 2015-02-02 DIAGNOSIS — A499 Bacterial infection, unspecified: Secondary | ICD-10-CM

## 2015-02-02 DIAGNOSIS — Z113 Encounter for screening for infections with a predominantly sexual mode of transmission: Secondary | ICD-10-CM

## 2015-02-02 DIAGNOSIS — N760 Acute vaginitis: Secondary | ICD-10-CM | POA: Diagnosis not present

## 2015-02-02 DIAGNOSIS — B9689 Other specified bacterial agents as the cause of diseases classified elsewhere: Secondary | ICD-10-CM

## 2015-02-02 MED ORDER — METRONIDAZOLE 0.75 % VA GEL: 1.0000 | Freq: Two times a day (BID) | VAGINAL | Status: DC

## 2015-02-02 NOTE — Progress Notes (Signed)
44 y.o.Married white g2p2002 here with complaint of vaginal symptoms of itching, burning, and increase discharge. Describes discharge as clear, watery, with odor.. Onset of symptoms 3-4 weeks ago. Denies new personal products or vaginal dryness. Desires STD screening. No STD concerns. Urinary symptoms none . [facility]raception [facility]. No other issues today.  O:Healthy female WDWN Affect: normal, orientation x 3  Exam: Abdomen: soft, non tender Lymph node: no enlargement or tenderness Pelvic exam: External genital: normal female BUS: negative Vagina: watery grey odorous discharge noted. Ph: 5.0   ,Wet prep taken, Cervix: normal, non tender, no CMT Uterus: normal, non tender Adnexa:normal, non tender, no masses or fullness noted   Wet Prep results: positive for clue   A:Normal pelvic exam BV STD screening   P:Discussed findings of BV and etiology. Discussed Aveeno or baking soda sitz bath for comfort. Avoid moist clothes or pads for extended period of time. If working out in gym clothes or swim suits for long periods of time change underwear or bottoms of swimsuit if possible. Questions addressed.  Rx Metrogel see order Lab: GC,Chlamydia  Rv prn

## 2015-02-02 NOTE — Patient Instructions (Signed)
Bacterial Vaginosis Bacterial vaginosis is a vaginal infection that occurs when the normal balance of bacteria in the vagina is disrupted. It results from an overgrowth of certain bacteria. This is the most common vaginal infection in women of childbearing age. Treatment is important to prevent complications, especially in pregnant women, as it can cause a premature delivery. CAUSES  Bacterial vaginosis is caused by an increase in harmful bacteria that are normally present in smaller amounts in the vagina. Several different kinds of bacteria can cause bacterial vaginosis. However, the reason that the condition develops is not fully understood. RISK FACTORS Certain activities or behaviors can put you at an increased risk of developing bacterial vaginosis, including:  Having a new sex partner or multiple sex partners.  Douching.  Using an intrauterine device (IUD) for contraception. Women do not get bacterial vaginosis from toilet seats, bedding, swimming pools, or contact with objects around them. SIGNS AND SYMPTOMS  Some women with bacterial vaginosis have no signs or symptoms. Common symptoms include:  Grey vaginal discharge.  A fishlike odor with discharge, especially after sexual intercourse.  Itching or burning of the vagina and vulva.  Burning or pain with urination. DIAGNOSIS  Your health care provider will take a medical history and examine the vagina for signs of bacterial vaginosis. A sample of vaginal fluid may be taken. Your health care provider will look at this sample under a microscope to check for bacteria and abnormal cells. A vaginal pH test may also be done.  TREATMENT  Bacterial vaginosis may be treated with antibiotic medicines. These may be given in the form of a pill or a vaginal cream. A second round of antibiotics may be prescribed if the condition comes back after treatment.  HOME CARE INSTRUCTIONS   Only take over-the-counter or prescription medicines as  directed by your health care provider.  If antibiotic medicine was prescribed, take it as directed. Make sure you finish it even if you start to feel better.  Do not have sex until treatment is completed.  Tell all sexual partners that you have a vaginal infection. They should see their health care provider and be treated if they have problems, such as a mild rash or itching.  Practice safe sex by using condoms and only having one sex partner. SEEK MEDICAL CARE IF:   Your symptoms are not improving after 3 days of treatment.  You have increased discharge or pain.  You have a fever. MAKE SURE YOU:   Understand these instructions.  Will watch your condition.  Will get help right away if you are not doing well or get worse. FOR MORE INFORMATION  Centers for Disease Control and Prevention, Division of STD Prevention: www.cdc.gov/std American Sexual Health Association (ASHA): www.ashastd.org  Document Released: 08/20/2005 Document Revised: 06/10/2013 Document Reviewed: 04/01/2013 ExitCare Patient Information 2015 [employer]. This information is not intended to replace advice given to you by your health care provider. Make sure you discuss any questions you have with your health care provider.  

## 2015-02-04 LAB — IPS N GONORRHOEA AND CHLAMYDIA BY PCR

## 2015-02-04 NOTE — Progress Notes (Signed)
Reviewed personally.  M. Suzanne Chaylee Ehrsam, MD.  

## 2015-02-16 ENCOUNTER — Telehealth: Payer: Self-pay | Admitting: Certified Nurse Midwife

## 2015-02-16 MED ORDER — HYLAFEM VA SUPP: 1.0000 | Freq: Every day | VAGINAL | Status: DC

## 2015-02-16 NOTE — Telephone Encounter (Signed)
Spoke with patient. Advised of message as seen below from Verner Chol CNM. Patient is agreeable. Rx for Hylafem place one suppository at bedtime x3 nights #3 0RF sent to [facility]. Patient is agreeable.  Routing to provider for final review. Patient agreeable to disposition. Will close encounter.

## 2015-02-16 NOTE — Telephone Encounter (Signed)
Patient is asking to talk with a nurse regarding her prescription. Patient talked to [facility] earlier today about this prescription.

## 2015-02-16 NOTE — Telephone Encounter (Signed)
Spoke with patient. Patient was last seen on 02/02/2015 for BV. Was prescribed Metrogel at that appointment. Patient used Metrogel x5 days. Finished treatment on 02/06/2015. Patient states she is still experiencing a vaginal odor since treatment. Denies vaginal discharge. Patient asking for another prescription of Metrogel or Flagyl. "My kids are out of school and I really can not come in for another appointment. If this second treatment does not work I would come in." Advised patient I will need to speak with provider regarding symptoms and rx request and return call. Patient is agreeable.

## 2015-02-16 NOTE — Telephone Encounter (Signed)
Will not give another Rx,for Metrogel. I will let her treat with Hylafem supp. For 3 days which will rebalance and should resolve odor.

## 2015-02-16 NOTE — Telephone Encounter (Signed)
Patient says she has something she want to discuss with nurse.

## 2015-02-16 NOTE — Telephone Encounter (Signed)
Called patient. She states she was advised by [facility] that her insurance will not cover Hylafem.  Patient asking [facility]. Advised patient that likely insurance coverage of Hylafem will not change due to location unless her coverage specifically states so. Advised we send Hylafem to [facility] because they are able to stock it and keep it on hand. Advised cost for treatment is 30 dollars. Patient agreeable to this and will pick up rx from [facility] as she was advised they have it on hand. Advised to call back with any further concerns. Patient agreeable. Routing to provider for final review. Patient agreeable to disposition. Will close encounter.

## 2015-02-28 ENCOUNTER — Telehealth: Payer: Self-pay | Admitting: Certified Nurse Midwife

## 2015-02-28 NOTE — Telephone Encounter (Signed)
Patient is asking to speak with Mertha Findersebbie Leonard's nurse. Patient is having some bacterial infection symptom. Last seen  02/02/15.

## 2015-02-28 NOTE — Telephone Encounter (Signed)
Spoke with patient. Patient states that she was treated with Metrogel for BV at 02/02/2015 appointment. Used hylafem on 6/15-6/17 due to symptoms persisting. States symptoms have not gone away still. Is going out of town Thursday and is requesting refill of Hylafem. Advised will need to be seen for further evaluation and treatment. Patient hesitant to schedule appointment. Advised with two rounds of treatment and persistent symptoms will need to be seen in office for further evaluation. Patient is agreeable. Appointment scheduled for tomorrow at 2:30pm with Verner Chol CNM. Agreeable to date and time.  Routing to provider for final review. Patient agreeable to disposition. Will close encounter.

## 2015-03-01 ENCOUNTER — Ambulatory Visit (INDEPENDENT_AMBULATORY_CARE_PROVIDER_SITE_OTHER): Payer: BLUE CROSS/BLUE SHIELD | Admitting: Certified Nurse Midwife

## 2015-03-01 ENCOUNTER — Encounter: Payer: Self-pay | Admitting: Certified Nurse Midwife

## 2015-03-01 VITALS — BP 100/60 | HR 72 | Resp 14 | Wt 149.0 lb

## 2015-03-01 DIAGNOSIS — N9489 Other specified conditions associated with female genital organs and menstrual cycle: Secondary | ICD-10-CM | POA: Diagnosis not present

## 2015-03-01 DIAGNOSIS — N760 Acute vaginitis: Secondary | ICD-10-CM | POA: Diagnosis not present

## 2015-03-01 DIAGNOSIS — A499 Bacterial infection, unspecified: Secondary | ICD-10-CM | POA: Diagnosis not present

## 2015-03-01 DIAGNOSIS — B9689 Other specified bacterial agents as the cause of diseases classified elsewhere: Secondary | ICD-10-CM

## 2015-03-01 DIAGNOSIS — N898 Other specified noninflammatory disorders of vagina: Secondary | ICD-10-CM

## 2015-03-01 NOTE — Progress Notes (Signed)
44 y.o.Married  g2p2002 here with complaint of vaginal symptoms of odor only.Describes discharge as normal. Onset of symptoms 4 days ago. Denies new personal products or vaginal dryness.No STD concerns. Urinary symptoms none . Contraception is [facility]ist[facility]. Patient is in moist clothing frequently with outdoor sports and swimming. Leaving for beach in a few days and does not want to keep having this odor. Describes it as a sour smell. History of BV treated with Metrogel and Hylafem. Feels the Hylafem helped the most if this is BV again, but had skin irritation with las infection 4 weeks ago. Spouse does not notice any odor and does not seems to occur after sexual activity. No other concerns today.   O:Healthy female WDWN Affect: normal, orientation x 3  Exam: Abdomen:non tender Lymph node: no enlargement or tenderness Pelvic exam: External genital: normal female, no lesions BUS: negative Vagina: normal physiological discharge noted. No odor noted from vagina or Q tip, patient also did not smell any odor  Affirm taken Cervix: normal, non tender, no CMT Uterus: normal, non tender Adnexa:normal, non tender, no masses or fullness noted   A:Normal pelvic exam History of Chronic BV Vaginal Odor   P:Discussed findings of vaginal ph changes and etiology. Discussed Aveeno or baking soda sitz bath for comfort. Avoid moist clothes or pads for extended period of time. If working out in gym clothes or swim suits for long periods of time change underwear or bottoms of swimsuit if possible. Olive Oil/Coconut Oil use for skin protection prior to activity can be used to external skin. Discussed with patient that she may actually be noted the sour smell of wet clothing from perspiration and not from the vagina. Encouraged to use external skin protection such as coconut oil to see if this changes what is occurring. Also changing underwear or using peri bottle with very small amount of baking soda to wash off with to  help with odor.Patient will try this. Will treat if affirm indicates infection. Discussed boric acid use one monthly also as a trial.  Rv prn

## 2015-03-02 ENCOUNTER — Other Ambulatory Visit: Payer: Self-pay

## 2015-03-02 LAB — WET PREP BY MOLECULAR PROBE
Candida species: NEGATIVE
GARDNERELLA VAGINALIS: NEGATIVE
TRICHOMONAS VAG: NEGATIVE

## 2015-03-02 MED ORDER — HYLAFEM VA SUPP: 1.0000 | Freq: Every day | VAGINAL | Status: DC

## 2015-03-02 NOTE — Telephone Encounter (Signed)
Pt requesting refill. See telephone note.

## 2015-03-03 NOTE — Progress Notes (Signed)
Reviewed personally.  M. Suzanne Antoinne Spadaccini, MD.  

## 2015-03-14 ENCOUNTER — Telehealth: Payer: Self-pay | Admitting: Obstetrics and Gynecology

## 2015-03-14 NOTE — Telephone Encounter (Signed)
Patient feels like her recent bacterial infection has not gone away completely. Last seen 03/01/15.

## 2015-03-14 NOTE — Telephone Encounter (Signed)
Spoke with patient. Patient states that she is experiencing a vaginal odor with discharge again. Was last seen on 03/01/2015. Affirm testing was negative. Hylafem #3 was sent in just in case symptoms persisted as she was going out of town. Patient has not used Hylafem at this time. "I feel like everything is just pacifying my problem. I have used Metrogel and Hylafem and will get better for a day or so and then I have the same symptoms. I have been to the office so much I am frustrated. I feel like I am not being medically treated and need something else to help with the problem." Is asking about using vinegar or yogurt for treatment. Advise I do not recommend either of these for treatment of symptoms. "I feel like I live in your office and still have the same symptoms." Patient is requesting I speak with Dr.Silva for further recommendations. Advised I will send her a message and return call. Patient is agreeable.

## 2015-03-14 NOTE — Telephone Encounter (Signed)
Spoke with patient. Advised of message as seen below from Dr.Silva. Patient is agreeable and verbalizes understanding. Appointment scheduled for 03/16/2015 at 11:30am with Dr.Silva. Patient is agreeable to date and time.  Routing to provider for final review. Patient agreeable to disposition. Will close encounter.   Patient aware provider will review message and nurse will return call if any additional advice or change of disposition.

## 2015-03-14 NOTE — Telephone Encounter (Signed)
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  I need to do a vaginal swab and pH test.  Please do not place anything in vagina for several days before visit, so I will not see any interfering substance.  This is a condition that needs to be treated with intravaginal steriods and sometimes an antibiotic.  If this is not present, I am going to recommend a prolonged course of boric acid suppositories per vagina.   Thanks.

## 2015-03-16 ENCOUNTER — Ambulatory Visit (INDEPENDENT_AMBULATORY_CARE_PROVIDER_SITE_OTHER): Payer: BLUE CROSS/BLUE SHIELD | Admitting: Obstetrics and Gynecology

## 2015-03-16 ENCOUNTER — Encounter: Payer: Self-pay | Admitting: Obstetrics and Gynecology

## 2015-03-16 VITALS — BP 126/90 | HR 74 | Ht 62.5 in | Wt 149.2 lb

## 2015-03-16 DIAGNOSIS — N760 Acute vaginitis: Secondary | ICD-10-CM | POA: Diagnosis not present

## 2015-03-16 DIAGNOSIS — N898 Other specified noninflammatory disorders of vagina: Secondary | ICD-10-CM

## 2015-03-16 DIAGNOSIS — N9489 Other specified conditions associated with female genital organs and menstrual cycle: Secondary | ICD-10-CM

## 2015-03-16 MED ORDER — NONFORMULARY OR COMPOUNDED ITEM: Status: DC

## 2015-03-16 NOTE — Progress Notes (Signed)
GYNECOLOGY  VISIT   HPI: 44 y.o.   Married  Caucasian  female   G2P2002 with No LMP recorded. Patient has had an ablation.   here for   Vaginitis recheck.  Feels her symptoms are present for a year and is frustrated.   Has had treatment of bacterial vaginosis in December.  Recent visit in June was negative for vaginitis.   Intercourse precipitates the odor symptoms.  Also under stress at home and wondering if this is having an effect on her symptoms.   States odor is the only symptom she tends to have.  Denies discharge or burning.  Denies itching.   Not sensitive to odors in general.   No new soaps or exposures. Not douching.   Status post ablation.  Also on Micronor.   GYNECOLOGIC HISTORY: No LMP recorded. Patient has had an ablation. Contraception: Ablation Menopausal hormone therapy: n/a Last mammogram: 07/05/2014 bi-rads 0; left breast ultrasound 9 mm complex cyst benign Last pap smear: 06/23/14 wnl neg hr hpv        OB History    Gravida Para Term Preterm AB TAB SAB Ectopic Multiple Living   Patient Active Problem List   Diagnosis Date Noted  . Chronic vaginitis 08/14/2013    Past Medical History  Diagnosis Date  . Headache(784.0)     not any longer  . Carpal tunnel syndrome, bilateral   . Hyperprolactinemia     Past Surgical History  Procedure Laterality Date  . Nasal sinus surgery      30 yrs ago  . Novasure ablation  05/24/2011    Procedure: NOVASURE ABLATION;  Surgeon: Melony Overly;  Location: [facility];  Service: Gynecology;  Laterality: N/A;  . Bladder suspension  05/24/2011    Procedure: TRANSVAGINAL TAPE (TVT) PROCEDURE;  Surgeon: Melony Overly;  Location: [facility];  Service: Gynecology;  Laterality: N/A;  with Cystoscopy  . Cystocele repair  05/24/2011    Procedure: ANTERIOR REPAIR (CYSTOCELE);  Surgeon: Melony Overly;  Location: [facility];  Service: Gynecology;  Laterality: N/A;    Current Outpatient Prescriptions  Medication  Sig Dispense Refill  . ALPRAZolam (XANAX) 0.5 MG tablet as needed.  0  . atenolol (TENORMIN) 25 MG tablet Take 25 mg by mouth daily.    . Calcium-Vitamin D (CALTRATE 600 PLUS-VIT D PO) Take 1 tablet by mouth daily.      . cyclobenzaprine (FLEXERIL) 10 MG tablet TAKE 1 TABLET BY ORAL ROUTE AT NIGHT.  2  . fluticasone (FLONASE) 50 MCG/ACT nasal spray Place 2 sprays into the nose daily.    Marland Kitchen loratadine (CLARITIN) 10 MG tablet Take 10 mg by mouth daily.    Marland Kitchen LYRICA 75 MG capsule 2 (two) times daily.  1  . Multiple Vitamins-Minerals (MULTIVITAMIN WITH MINERALS) tablet Take 1 tablet by mouth daily.      . norethindrone (MICRONOR,CAMILA,ERRIN) 0.35 MG tablet Take 1 tablet (0.35 mg total) by mouth daily. 3 Package 3  . SUMAtriptan (IMITREX) 100 MG tablet Take 100 mg by mouth as needed.     No current facility-administered medications for this visit.     ALLERGIES: Erythromycin and Tolectin  Family History  Problem Relation Age of Onset  . Breast cancer Maternal Aunt     metastatic disease    History   Social History  . Marital Status: Married    Spouse Name: N/A  .  Number of Children: N/A  . Years of Education: N/A   Occupational History  . Not on file.   Social History Main Topics  . Smoking status: Never Smoker   . Smokeless tobacco: Never Used  . Alcohol Use: Yes     Comment: rarely wine  . Drug Use: No  . Sexual Activity:    Partners: Male    Birth Control/ Protection: Surgical     Comment: husband had vasectomy/Novasure ablation 2012   Other Topics Concern  . Not on file   Social History Narrative    ROS:  Pertinent items are noted in HPI.  PHYSICAL EXAMINATION:    BP 126/90 mmHg  Pulse 74  Ht 5' 2.5" (1.588 m)  Wt 149 lb 3.2 oz (67.677 kg)  BMI 26.84 kg/m2    General appearance: alert, cooperative and appears stated age   Pelvic: External genitalia:  no lesions              Urethra:  normal appearing urethra with no masses, tenderness or lesions               Bartholins and Skenes: normal                 Vagina: normal appearing vagina with normal color and discharge, no lesions              Cervix: no lesions               Bimanual Exam:  Uterus:  normal size, contour, position, consistency, mobility, non-tender              Adnexa: normal adnexa and no mass, fullness, tenderness           Chaperone was present for exam.  ASSESSMENT  Vaginal odor. Recurrent vaginitis.  I do not think that this is desquamative vaginitis.   PLAN  Extensively counseled regarding vaginitis.  Affirm testing done.  Will wait for Affirm to come back but will likely do empiric treatment of boric acid suppositories, one per week, for 15 weeks (until annual exam is due).   An After Visit Summary was printed and given to the patient.  __25____ minutes face to face time of which over 50% was spent in counseling.

## 2015-03-16 NOTE — Patient Instructions (Signed)
We will let you know about the test results as soon as they are available!

## 2015-03-17 LAB — WET PREP BY MOLECULAR PROBE
Candida species: NEGATIVE
Gardnerella vaginalis: NEGATIVE
Trichomonas vaginosis: NEGATIVE

## 2015-03-18 ENCOUNTER — Telehealth: Payer: Self-pay | Admitting: Obstetrics and Gynecology

## 2015-03-18 NOTE — Telephone Encounter (Signed)
Spoke with patient. Advised of message as seen below from Dr.Silva. Patient is agreeable and verbalizes understanding. Will start using rx at this time.  Notes Recorded by Patton SallesBrook E [name], MD on 03/18/2015 at 6:26 AM Please let patient know that her Affirm is negative.  She may go ahead with the empiric boric acid suppository once per week at hs.  She will return for her annual exam in the fall and we will evaluate her progress then.  Routing to provider for final review. Patient agreeable to disposition. Will close encounter.   Patient aware provider will review message and nurse will return call if any additional advice or change of disposition.

## 2015-03-18 NOTE — Telephone Encounter (Signed)
Patient calling for results.

## 2015-04-20 ENCOUNTER — Telehealth: Payer: Self-pay | Admitting: Obstetrics and Gynecology

## 2015-04-20 MED ORDER — FLUCONAZOLE 150 MG PO TABS: 150.0000 mg | ORAL_TABLET | Freq: Once | ORAL | Status: DC

## 2015-04-20 NOTE — Telephone Encounter (Signed)
I recommend Dilfucan 150 mg po x 1.  Repeat in 48 hours if symptoms persist.  Office visit if symptoms persist. The next step is to do treatment of desquamative vaginitis with either intravaginal clindamycin cream or hydrocortisone cream or suppositories.

## 2015-04-20 NOTE — Telephone Encounter (Signed)
Spoke with patient. Advised of message as seen below from Dr.Silva. Patient is agreeable. Rx for Diflucan 150 po x1 repeat in 48 hours if symptoms persist #[facility] [address] per patients request. Will return call to schedule OV if symptoms persist.  Routing to provider for final review. Patient agreeable to disposition. Will close encounter.

## 2015-04-20 NOTE — Telephone Encounter (Signed)
Patient calling and said, "I need a prescription for some type of infection I get with redness, itching and discharge that I have been treated for before." She declined to schedule an appointment until speaking with the nurse.  [facility] [address]

## 2015-04-20 NOTE — Telephone Encounter (Signed)
Patient returning call.

## 2015-04-20 NOTE — Telephone Encounter (Signed)
Left message to call Shalonda Sachse at [phone]. 

## 2015-04-20 NOTE — Telephone Encounter (Signed)
Spoke with patient. Patient states she has been using boric acid suppositories once per week for 2 weeks for recurrent vaginitis. States vaginal odor has decreased but is still present. 2 days ago began to have vaginal redness, itching, irritation, and "mucousy white" discharge. Is due to use boric acid again tonight. States that vaginal discharge is intermittent. Denies any pelvic pain. "I have been in to the office so many times for this. I am hoping there is something different or she can do for me without me coming in." Advised will need to speak with Dr.Silva and return call with further recommendations. Patient is agreeable and verbalizes understanding.

## 2015-05-10 ENCOUNTER — Telehealth: Payer: Self-pay | Admitting: Obstetrics and Gynecology

## 2015-05-10 NOTE — Telephone Encounter (Signed)
Patient would like nurse to call her regarding some issues she is having.

## 2015-05-10 NOTE — Telephone Encounter (Signed)
The last times she has been in her Affirms have been negative. I see no meds. Given except boric acid recently by Dr. Edward Jolly and Hylafem by me. I see not creams listed.

## 2015-05-10 NOTE — Telephone Encounter (Signed)
Spoke with patient. Patient states that she took Diflucan on 8/17 and 8/19 for yeast. Was prescribed  boric acid suppositories once per week for recurrent vaginitis. Patient states she has bot used boric acid in three weeks as she wanted to make sure she got rid of her yeast infection. States that the Diflucan helped some, but her symptoms have persisted. States she has had recurrent vaginitis before and had to use a cream along with her Diflucan. Advised patient with symptoms persisting after taking Diflucan she will need and OV. Patient declines. "My son is having surgery and I can not come in this week at all." Advised of OTC treatment but patient states "The only thing that works for me is Diflucan and the cream medication." Advised I will speak with covering provider in regards to symptoms and treatment and return call. Patient is agreeable.  Routing to Verner Chol CNM as patient has seen her in the past.

## 2015-05-11 MED ORDER — TERCONAZOLE 0.4 % VA CREA: 1.0000 | TOPICAL_CREAM | Freq: Every day | VAGINAL | Status: DC

## 2015-05-11 NOTE — Telephone Encounter (Signed)
Spoke with patient. Patient states that her symptoms are yeast related this time. "I have had BV and yeast infections so many times. I am sure this yeast." Patient states she has thick white vaginal discharge. Used expired Terazol cream last night and states she feels slightly better. "Usually I have to take the Diflucan and use the Terazol cream together." Offered patient an office visit for evaluation but patient again declines due to her son's surgery. Patient is requesting Rx for Terazol and Diflucan or "I can keep using the old Terazol if I can have Diflucan." Advised I will speak with Dr.Silva again regarding treatment and return call. Patient is agreeable.

## 2015-05-11 NOTE — Telephone Encounter (Signed)
OK for Terazol 7.

## 2015-05-11 NOTE — Telephone Encounter (Signed)
I am recommending a course of Metrogel for patient.  She usually have more odor to her symptoms. Metrogel 0.75% pv at hs for 5 days.  Please send to her pharmacy of choice.   After treating with the Metrogel, I am recommending she start the weekly boric acid suppositories again.

## 2015-05-11 NOTE — Telephone Encounter (Signed)
Spoke with patient. Advised of message as seen below from Dr.Silva. Patient is agreeable and verbalizes understanding. New rx [facility] Ter[facility] 7 [facility] Patient is agreeable.  Routing to provider for final review. Patient agreeable to disposition. Will close encounter.

## 2015-05-11 NOTE — Telephone Encounter (Signed)
Patient returned call to check on status of call. Patient states that she has expired Terazol at home from previous treatment of vaginitis. Patient is requesting I check with Dr.Silva as "Dr.Silva knows my history well." Advised I will check with Dr.Silva and return call. Patient is agreeable.

## 2015-06-22 ENCOUNTER — Other Ambulatory Visit: Payer: Self-pay | Admitting: Obstetrics and Gynecology

## 2015-06-22 NOTE — Telephone Encounter (Signed)
Medication refill request: Karina Ramirez  Last AEX:  06-22-13  Next AEX: 06-29-15  Last MMG (if hormonal medication request): 07-05-14 - Ultra sound has stable 9mm complex cyst -routine MM in 1 year Refill authorized: please advise

## 2015-06-29 ENCOUNTER — Other Ambulatory Visit: Payer: Self-pay | Admitting: Obstetrics and Gynecology

## 2015-06-29 ENCOUNTER — Ambulatory Visit (INDEPENDENT_AMBULATORY_CARE_PROVIDER_SITE_OTHER): Payer: BLUE CROSS/BLUE SHIELD | Admitting: Obstetrics and Gynecology

## 2015-06-29 ENCOUNTER — Encounter: Payer: Self-pay | Admitting: Obstetrics and Gynecology

## 2015-06-29 VITALS — BP 110/70 | HR 64 | Resp 14 | Ht 62.5 in | Wt 149.0 lb

## 2015-06-29 DIAGNOSIS — N760 Acute vaginitis: Secondary | ICD-10-CM | POA: Diagnosis not present

## 2015-06-29 DIAGNOSIS — Z01419 Encounter for gynecological examination (general) (routine) without abnormal findings: Secondary | ICD-10-CM

## 2015-06-29 DIAGNOSIS — Z113 Encounter for screening for infections with a predominantly sexual mode of transmission: Secondary | ICD-10-CM

## 2015-06-29 DIAGNOSIS — F419 Anxiety disorder, unspecified: Secondary | ICD-10-CM | POA: Diagnosis not present

## 2015-06-29 LAB — WET PREP BY MOLECULAR PROBE
Candida species: NEGATIVE
GARDNERELLA VAGINALIS: NEGATIVE
Trichomonas vaginosis: NEGATIVE

## 2015-06-29 LAB — HEPATITIS C ANTIBODY: HCV AB: NEGATIVE

## 2015-06-29 MED ORDER — NORETHINDRONE 0.35 MG PO TABS: ORAL_TABLET | ORAL | Status: DC

## 2015-06-29 NOTE — Patient Instructions (Signed)

## 2015-06-29 NOTE — Progress Notes (Signed)
Patient ID: Karina Ramirez, female   DOB: Sep 05, 1970, 44 y.o.   MRN: [id number] 44 y.o. [id number] Married Caucasian female here for annual exam.    Has had an ablation.  On Micronor for cycle control.   Continues to struggle with vaginitis for many years.  Has treated for both yeast and bacterial vaginosis - boric acid, Terazol cream, Hylafem - all more recently.  Wonders if her stress is causing the vaginitis.  Does want STD testing.     Is under a lot of stress.  Financial issues.  May be moving to [place].   Now on Lyrica in addition to Cymbalta.  Lyrica is for back pain and the Cymbalta was just started.  Dr. Eula Listen prescribing both of these.    PCP:  Dr. Hyman Bible  No LMP recorded. Patient has had an ablation.          Sexually active: Yes.    The current method of family planning is OCP (estrogen/progesterone).    Exercising: No.  The patient does not participate in regular exercise at present. Smoker:  no  Health Maintenance: Pap:  06-23-14 WNL NEG HR HPV History of abnormal Pap:  Yes 06-22-13 ASCUS pap with Pos. HR HPV. Colposcopy 07-09-13 showed low grade dysplasia only. No treatment necessary. Repeat pap 01-11-14 wnl:neg HR HPV MMG:  06-2015- Normal per patient Colonoscopy:  N/A BMD:   N/A TDaP:  07/2012 Screening Labs:  Hb today: PCP, Urine today: PCP   reports that she has never smoked. She has never used smokeless tobacco. She reports that she drinks alcohol. She reports that she does not use illicit drugs.  Past Medical History  Diagnosis Date  . Headache(784.0)     not any longer  . Carpal tunnel syndrome, bilateral   . Hyperprolactinemia (HCC)   . Anxiety     Past Surgical History  Procedure Laterality Date  . Nasal sinus surgery      30 yrs ago  . Novasure ablation  05/24/2011    Procedure: NOVASURE ABLATION;  Surgeon: Melony Overly;  Location: [facility];  Service: Gynecology;  Laterality: N/A;  . Bladder suspension  05/24/2011    Procedure:  TRANSVAGINAL TAPE (TVT) PROCEDURE;  Surgeon: Melony Overly;  Location: [facility];  Service: Gynecology;  Laterality: N/A;  with Cystoscopy  . Cystocele repair  05/24/2011    Procedure: ANTERIOR REPAIR (CYSTOCELE);  Surgeon: Melony Overly;  Location: [facility];  Service: Gynecology;  Laterality: N/A;    Current Outpatient Prescriptions  Medication Sig Dispense Refill  . ALPRAZolam (XANAX) 0.5 MG tablet as needed.  0  . atenolol (TENORMIN) 25 MG tablet Take 25 mg by mouth daily.    . Calcium-Vitamin D (CALTRATE 600 PLUS-VIT D PO) Take 1 tablet by mouth daily.      Marland Kitchen CAMILA 0.35 MG tablet TAKE 1 TABLET (0.35 MG TOTAL) BY MOUTH DAILY. 1 Package 0  . cyclobenzaprine (FLEXERIL) 10 MG tablet TAKE 1 TABLET BY ORAL ROUTE AT NIGHT.  2  . fluticasone (FLONASE) 50 MCG/ACT nasal spray Place 2 sprays into the nose daily.    Marland Kitchen loratadine (CLARITIN) 10 MG tablet Take 10 mg by mouth daily.    Marland Kitchen LYRICA 75 MG capsule 2 (two) times daily.  1  . Multiple Vitamins-Minerals (MULTIVITAMIN WITH MINERALS) tablet Take 1 tablet by mouth daily.      . NONFORMULARY OR COMPOUNDED ITEM Place one suppository in the vagina once a week at bedtime. Do this for the  next 15 weeks. 15 each 0  . DULoxetine (CYMBALTA) 20 MG capsule Take 20 mg by mouth 2 (two) times daily.  11   No current facility-administered medications for this visit.    Family History  Problem Relation Age of Onset  . Breast cancer Maternal Aunt     metastatic disease    ROS:  Pertinent items are noted in HPI.  Otherwise, a comprehensive ROS was negative.  Exam:   BP 110/70 mmHg  Pulse 64  Resp 14  Ht 5' 2.5" (1.588 m)  Wt 149 lb (67.586 kg)  BMI 26.80 kg/m2    General appearance: alert, cooperative and appears stated age Head: Normocephalic, without obvious abnormality, atraumatic Neck: no adenopathy, supple, symmetrical, trachea midline and thyroid normal to inspection and palpation Lungs: clear to auscultation bilaterally Breasts: normal  appearance, no masses or tenderness, Inspection negative, No nipple retraction or dimpling, No nipple discharge or bleeding, No axillary or supraclavicular adenopathy Heart: regular rate and rhythm Abdomen: soft, non-tender; bowel sounds normal; no masses,  no organomegaly Extremities: extremities normal, atraumatic, no cyanosis or edema Skin: Skin color, texture, turgor normal. No rashes or lesions Lymph nodes: Cervical, supraclavicular, and axillary nodes normal. No abnormal inguinal nodes palpated Neurologic: Grossly normal  Pelvic: External genitalia:  no lesions              Urethra:  normal appearing urethra with no masses, tenderness or lesions              Bartholins and Skenes: normal                 Vagina: normal appearing vagina with normal color and discharge, no lesions              Cervix: no lesions              Pap taken:  Yes. Bimanual Exam:  Uterus:  normal size, contour, position, consistency, mobility, non-tender              Adnexa: normal adnexa and no mass, fullness, tenderness              Rectovaginal: Yes.  .  Confirms.              Anus:  normal sphincter tone, no lesions  Chaperone was present for exam.  Assessment:   Well woman visit with normal exam. Chronic vaginitis.  Status post endometrial ablation.  OCPs for cycle regulation.  Hx LGSIL.  Situational stress and anxiety.  Hx elevated prolactin.   Plan: Yearly mammogram recommended after age 72.  Recommended self breast exam.  Pap and HR HPV as above. Discussed Calcium, Vitamin D, regular exercise program including cardiovascular and weight bearing exercise. Labs performed.  Yes.  .   See orders.  STD testing, Prolactin, and Affirm testing.   Refills given on medications.  Yes.  Continue with OCPs.  No treatment for vaginitis.  Will await Affirm and pap results. Discussion with patient about her anxiety and how I do think that it is impacting her on her vaginal health.  She has had several  negative Affirm tests when she has presented for evaluation and she and I both believe that she does not have desquamative vaginitis.  We talked about how everyone manifests anxiety and worry differently and that it is possible she manifests her anxiety in her vaginal and vulvar area.  We did discuss that vaginal discharge is normal as long as there is no pain, itching,  burning, or odor.  We did discuss that she can also see an ENT due to her sensitivity to smell to be sure she does not have an olfactory disorder.  We also discussed counseling for anxiety reduction.  At this point, we have not done any additional referrals as patient may be moving in the near future.  She knows she is welcome her for any future gynecologic needs.  Follow up annually and prn.   After visit summary provided.   An additional 15 minutes spent regarding vaginitis and anxiety - of which over 50% was spent in direct counseling.

## 2015-06-30 ENCOUNTER — Telehealth: Payer: Self-pay

## 2015-06-30 ENCOUNTER — Encounter: Payer: Self-pay | Admitting: Obstetrics and Gynecology

## 2015-06-30 LAB — STD PANEL
HEP B S AG: NEGATIVE
HIV 1&2 Ab, 4th Generation: NONREACTIVE

## 2015-06-30 NOTE — Telephone Encounter (Signed)
Spoke with patient. Advised of results as seen below from Dr.Silva. Patient is agreeable and verbalizes understanding.  Routing to provider for final review. Patient agreeable to disposition. Will close encounter.     

## 2015-06-30 NOTE — Telephone Encounter (Signed)
-----   Message from Patton SallesBrook E [name], MD sent at 06/30/2015  1:14 PM EDT ----- Please give patient results from testing showing no vaginitis.  I do not recommend any treatment at this time.   Blood work was negative for HIV, syphilis, hep B, and hep C.   Pap and gonorrhea and chlamydia testing are pending.  If any vaginitis shows up on the pap, we can treat then.   i am going to try to add a prolactin level to her labs as well.  I don't think her PCP is checking this.   Cc- Claudette LawsAmanda Dixon

## 2015-07-01 ENCOUNTER — Telehealth: Payer: Self-pay | Admitting: Obstetrics and Gynecology

## 2015-07-01 LAB — PROLACTIN: PROLACTIN: 22.6 ng/mL

## 2015-07-01 NOTE — Telephone Encounter (Signed)
Patient calling for most recent results. °

## 2015-07-01 NOTE — Telephone Encounter (Signed)
Spoke with patient. Advised the results of her pap smear and GC/Chl testing are still pending. Our office will give her a call once these have returned to discuss the results. Patient is agreeable.  Routing to provider for final review. Patient agreeable to disposition. Will close encounter.

## 2015-07-02 LAB — IPS N GONORRHOEA AND CHLAMYDIA BY PCR

## 2015-07-04 ENCOUNTER — Telehealth: Payer: Self-pay

## 2015-07-04 LAB — IPS PAP TEST WITH HPV

## 2015-07-04 NOTE — Telephone Encounter (Signed)
-----   Message from Patton SallesBrook E [name], MD sent at 07/03/2015  6:52 PM EDT ----- Please inform patient that her testing for gonorrhea and chlamydia are negative.  Pap is still pending.   Cc- Claudette LawsAmanda Dixon

## 2015-07-04 NOTE — Telephone Encounter (Signed)
Spoke with patient. Results given. Patient is agreeable and verbalizes understanding.  Routing to provider for final review. Patient agreeable to disposition. Will close encounter.     

## 2015-07-04 NOTE — Telephone Encounter (Signed)
Left message to call Canon Gola at [phone]. 

## 2015-07-05 ENCOUNTER — Telehealth: Payer: Self-pay | Admitting: Obstetrics and Gynecology

## 2015-07-05 NOTE — Telephone Encounter (Signed)
Spoke with patient. Patient states that she usually takes Karina Ramirez. When she went to the pharmacy she was given Ortho Micronor 0.35 mg. Patient is asking if this is safe to take. Advised this is safe to take as it is the same dosage and progesterone only. Patient is agreeable and verbalizes understanding. Advised will return call with pap smear results once these have been reviewed by Dr.Silva. Patient is agreeable.

## 2015-07-05 NOTE — Telephone Encounter (Signed)
Patient is checking to see if her results are in. She also has a question regarding her birth control.

## 2015-07-06 NOTE — Telephone Encounter (Signed)
Routing to Dr.Silva for review and advise. 

## 2015-07-06 NOTE — Telephone Encounter (Signed)
Patient checking on pap results was told they would be in today. Best contact # [phone]

## 2015-07-07 MED ORDER — METRONIDAZOLE 0.75 % VA GEL: 1.0000 | Freq: Every day | VAGINAL | Status: DC

## 2015-07-07 NOTE — Telephone Encounter (Signed)
I am recommending Metrogel pv at hs for 5 nights. Please send to pharmacy of choice. I would also like to have patient see ENT for further evaluation. I would like to rule out an olfactory disorder. We talked about this at her last visit as well.  [facility] refer [facility], first available.

## 2015-07-07 NOTE — Telephone Encounter (Signed)
Please inform patient that her pap is negative and HR HPV are negative.  No sign of vaginitis on the pap either.  I do not recommend treatment for vaginitis at this time. Annual exam in one year.  Next pap can actually wait for 3 years.

## 2015-07-07 NOTE — Telephone Encounter (Signed)
Spoke with patient. Advised of message as seen below from Dr.Silva. Patient is agreeable. Patient declines referral to [facility] at this time. "We are in the middle of possibly moving so I would rather hold off on that for now. I want to try the Metrogel first and see how I do." Rx for Metrogel 0.75 % placed one applicator at night for 5 days [facility]. Patient is agreeable. Will return call with any persist symptoms, new symptoms, or if she would like referral to ENT.  Routing to provider for final review. Patient agreeable to disposition. Will close encounter.

## 2015-07-07 NOTE — Telephone Encounter (Signed)
Spoke with patient. Advised patient of message as seen below from Dr.Silva. Patient states that she is still experiencing a vaginal odor that is making her uncomfortable. States she was experiencing this at the time of her appointment as well. "It is just really embarrassing because I can smell it through my clothes, but all my testing was negative. I just want something to keep it controlled. I feel like I need more answers." Over the last few days feels the smell has increased and it like "rotten meat." Patient is requesting additional help for the symptom she is experiencing. Advised I will speak with Dr.Silva and return call. Patient is agreeable.

## 2015-07-21 ENCOUNTER — Telehealth: Payer: Self-pay | Admitting: Obstetrics and Gynecology

## 2015-07-21 MED ORDER — CAMILA 0.35 MG PO TABS: ORAL_TABLET | ORAL | Status: DC

## 2015-07-21 NOTE — Telephone Encounter (Signed)
Spoke with patient. Patient states that she was given the generic of Camila, Norethindrone. Since taking the Norethindrone she has been experiencing hot flashes. Patient is requesting rx be sent for Camila only as she states she had no problems with the Camila. Rx changed to Camila 0.35 mg dispense as written #3 2RF. Patient states she is in the first week of her new pack of Norethindrone 0.35 mg. Advised she may go ahead and start the new pack of Camila as these are the same dosage. Advised will likely have to pay OOP for Camila as it has not been one month since her last pack was filled. Patient is agreeable and verbalizes understanding.  Routing to provider for final review. Patient agreeable to disposition. Will close encounter.

## 2015-07-21 NOTE — Telephone Encounter (Signed)
Patient was on camila and her pharmacy gave her norethindrone which is the generic. Patient says since she has been on it she has been having hot flashes which are very aggravating. She was told that we would send in Camila only if she had any problems. [facility] [address] and would prefer 3 month supply if possible.  Best # to reach patient [phone]

## 2015-08-31 ENCOUNTER — Telehealth: Payer: Self-pay | Admitting: Obstetrics and Gynecology

## 2015-08-31 NOTE — Telephone Encounter (Signed)
It sounds like this patient has a complicated history of vaginitis. Her last wet prep probe was negative. I think she should be evaluated if she is having symptoms.

## 2015-08-31 NOTE — Telephone Encounter (Signed)
Patient requesting refill of Boric Acid 600 mg suppositories which she said Dr. Edward JollySilva has her taking for suppression therapy sent to [facility]. Best # to reach: [phone]

## 2015-08-31 NOTE — Telephone Encounter (Signed)
Medication refill request: Boric acid Last AEX:  06/29/15 Dr. Edward JollySilva  Next AEX: 07/13/16  Last MMG (if hormonal medication request): 06/16/15 [id number] Refill authorized: 03/16/15 #[id number]. Today please advise.

## 2015-09-01 NOTE — Telephone Encounter (Signed)
The patient has had 3 affirm tests this year, all normal with vaginitis symptoms. Boric acid is not meant for long term use. I think if she is symptomatic she should see Dr Edward JollySilva. Boric acid isn't without risk.

## 2015-09-01 NOTE — Telephone Encounter (Signed)
Spoke with patient. She states that she was under the impression from Dr. Edward JollySilva that she could continue Boric Acid suppositories for suppression if she would like to. She states she is not having any symptoms currently but feels this is because she is using the boric acid suppositories. She states her main concern is vaginal odor and when using Boric Acid this is under control.  Advised of Message from Dr. Oscar LaJertson and patient declines to schedule an office visit for evaluation as she is not having symptoms and attributes this to use of boric acid and would like to continue.  Patient requesting refill prior to end of the year for insurance purposes. Advised that Dr. Edward JollySilva is not in the office but will send message back to Dr. Oscar LaJertson again for review.

## 2015-09-02 NOTE — Telephone Encounter (Signed)
Call to patient she is advised of message from Dr. Oscar LaJertson.  Patient states she really feels that Dr. Edward JollySilva advised her that she can use this for long term and that she would like to continue to do so. Advised will send message to Dr. Edward JollySilva and return call with her response.

## 2015-09-05 NOTE — Telephone Encounter (Signed)
I am recommending office evaluation for vaginitis using Affirm testing.  If negative, I am recommending ENT evaluation for olfactory disorders, disorders of sense of smell. Patient and I have discussed this plan already in the office, as she has had negative Affirm testing for the last several visits.

## 2015-09-07 NOTE — Telephone Encounter (Signed)
Return call to patient. Advised Dr Edward JollySilva recommends office visit for evaluation before refill of boric acid suppository. Patient states she had intended to use the boric acid suppository long term as ongoing treatment.  Advised this has been previously prescribed last July as a one time 15 week course until annual exam. At annual exam in October 2016, evaluation and Affirm testing was negative and no further treatment was recommended. ENT referral was recommended which patient declined. Patient actually states she called for the refill last week before end of the year benefits expired. She actually has a few suppositories left so she has some to use is she desires. Declines appointment and states she will "give it more time."   Encounter closed.

## 2015-10-10 ENCOUNTER — Ambulatory Visit (INDEPENDENT_AMBULATORY_CARE_PROVIDER_SITE_OTHER): Payer: BLUE CROSS/BLUE SHIELD

## 2015-10-10 ENCOUNTER — Ambulatory Visit (INDEPENDENT_AMBULATORY_CARE_PROVIDER_SITE_OTHER): Payer: BLUE CROSS/BLUE SHIELD | Admitting: Podiatry

## 2015-10-10 ENCOUNTER — Encounter: Payer: Self-pay | Admitting: Podiatry

## 2015-10-10 ENCOUNTER — Telehealth: Payer: Self-pay | Admitting: *Deleted

## 2015-10-10 VITALS — BP 112/69 | HR 72 | Resp 16

## 2015-10-10 DIAGNOSIS — M722 Plantar fascial fibromatosis: Secondary | ICD-10-CM

## 2015-10-10 MED ORDER — METHYLPREDNISOLONE 4 MG PO TBPK: ORAL_TABLET | ORAL | Status: DC

## 2015-10-10 MED ORDER — MELOXICAM 15 MG PO TABS: 15.0000 mg | ORAL_TABLET | Freq: Every day | ORAL | Status: DC

## 2015-10-10 NOTE — Progress Notes (Signed)
   Subjective:    Patient ID: Karina Ramirez, female    DOB: 04/21/1971, 45 y.o.   MRN: [id number]  HPI: She presents today with a chief complaint of plantar fasciitis right greater than left 2 years. She has been with another doctor for several months to try to alleviate her symptoms but to no avail. She has had cortisone shots and orthotics as well as braces. Nothing seems to help. She has tried different shoes such as SAS and Asics. She denies trauma to the foot. 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    Review of Systems  Psychiatric/Behavioral: The patient is nervous/anxious.   All other systems reviewed and are negative.      Objective:   Physical Exam: She presents today with her husband 55 year old white female vital signs stable alert and oriented 3. Pulses are intact neurologic sensorium is intact deep tendon reflexes are intact and muscle strength is intact. Orthopedic evaluation of his resolved joints distal to the ankle for range of motion without crepitation. She has pain on palpation right calcaneus at the plantar fascial calcaneal insertion site right greater than left. Radiographs taken today do demonstrate a chronic proximal plantar fasciitis bilateral. Cutaneous evaluation of a straight supple well-hydrated cutis no open wounds.        Assessment & Plan:  Chronic intractable plantar fasciitis right greater than left.  Plan: Started her on a Medrol Dosepak to be followed by meloxicam. Injected her bilateral heels today with Kenalog and local anesthetic provided her with plantar fascial braces and a night splint. Discussed the probable need for surgical intervention due to the chronicity of this condition. Also dispensed a plantar fascial night splint. We discussed appropriate shoe gear stretching exercises and ice therapy I will follow-up with her in 1 month.

## 2015-10-10 NOTE — Telephone Encounter (Signed)
Pt states the medications prescribed by Dr. Al Corpus are not at the pharmacy.  I reviewed Medication orders and the medications were sent to [facility] at 1140am today.  I informed pt and she states they should have been [facility] to [facility] [address]  [facility] and informed pt.

## 2015-10-10 NOTE — Patient Instructions (Signed)

## 2015-10-31 ENCOUNTER — Encounter: Payer: Self-pay | Admitting: Obstetrics and Gynecology

## 2015-10-31 ENCOUNTER — Ambulatory Visit (INDEPENDENT_AMBULATORY_CARE_PROVIDER_SITE_OTHER): Payer: BLUE CROSS/BLUE SHIELD | Admitting: Obstetrics and Gynecology

## 2015-10-31 VITALS — BP 126/80 | HR 70 | Ht 62.5 in | Wt 154.4 lb

## 2015-10-31 DIAGNOSIS — N898 Other specified noninflammatory disorders of vagina: Secondary | ICD-10-CM

## 2015-10-31 DIAGNOSIS — N9489 Other specified conditions associated with female genital organs and menstrual cycle: Secondary | ICD-10-CM

## 2015-10-31 NOTE — Progress Notes (Signed)
Patient ID: Karina Ramirez, female   DOB: 1970/12/18, 45 y.o.   MRN: [id number] GYNECOLOGY  VISIT   HPI: 45 y.o.   Married  Caucasian  female   G2P2002 with No LMP recorded. Patient has had an ablation.   here for vaginal odor--no real discharge. Had some boric acid capsules on hand and felt is helped some. Not itching.  Partner is also noting an odor.  Feeling like she is more warm and wondering if she is going through perimenopause. Feels like she is sweating more.   Occasionally has breakthrough bleeding on the Micronor.   Not moving to [facility] now.   GYNECOLOGIC HISTORY: No LMP recorded. Patient has had an ablation. Contraception:Vasectomy/Ablation/OCPs--[name] Menopausal hormone therapy: n/a Last mammogram: 06-16-15 3D/density cat.C/Neg/BiRads1:[facility] Last pap smear: 06-29-15 Neg:Neg HR HPV        OB History    Gravida Para Term Preterm AB TAB SAB Ectopic Multiple Living   Patient Active Problem List   Diagnosis Date Noted  . Chronic vaginitis 08/14/2013    Past Medical History  Diagnosis Date  . Headache(784.0)     not any longer  . Carpal tunnel syndrome, bilateral   . Hyperprolactinemia (HCC)   . Anxiety     Past Surgical History  Procedure Laterality Date  . Nasal sinus surgery      30 yrs ago  . Novasure ablation  05/24/2011    Procedure: NOVASURE ABLATION;  Surgeon: Melony Overly;  Location: [facility];  Service: Gynecology;  Laterality: N/A;  . Bladder suspension  05/24/2011    Procedure: TRANSVAGINAL TAPE (TVT) PROCEDURE;  Surgeon: Melony Overly;  Location: [facility];  Service: Gynecology;  Laterality: N/A;  with Cystoscopy  . Cystocele repair  05/24/2011    Procedure: ANTERIOR REPAIR (CYSTOCELE);  Surgeon: Melony Overly;  Location: [facility];  Service: Gynecology;  Laterality: N/A;    Current Outpatient Prescriptions  Medication Sig Dispense Refill  . ALPRAZolam (XANAX) 0.5 MG tablet as needed.  0  . atenolol (TENORMIN) 25 MG  tablet Take 25 mg by mouth daily.    . Calcium-Vitamin D (CALTRATE 600 PLUS-VIT D PO) Take 1 tablet by mouth daily.      Marland Kitchen CAMILA 0.35 MG tablet TAKE 1 TABLET (0.35 MG TOTAL) BY MOUTH DAILY. 3 Package 2  . cyclobenzaprine (FLEXERIL) 10 MG tablet TAKE 1 TABLET BY ORAL ROUTE AT NIGHT.  2  . DULoxetine (CYMBALTA) 20 MG capsule Take 20 mg by mouth 2 (two) times daily.  11  . fluticasone (FLONASE) 50 MCG/ACT nasal spray Place 2 sprays into the nose daily.    Marland Kitchen loratadine (CLARITIN) 10 MG tablet Take 10 mg by mouth daily.    Marland Kitchen LYRICA 75 MG capsule 2 (two) times daily.  1  . meloxicam (MOBIC) 15 MG tablet Take 1 tablet (15 mg total) by mouth daily. 30 tablet 3  . Multiple Vitamins-Minerals (MULTIVITAMIN WITH MINERALS) tablet Take 1 tablet by mouth daily.      . NONFORMULARY OR COMPOUNDED ITEM Place one suppository in the vagina once a week at bedtime. Do this for the next 15 weeks. 15 each 0   No current facility-administered medications for this visit.     ALLERGIES: Erythromycin and Tolectin  Family History  Problem Relation Age of Onset  . Breast cancer Maternal Aunt     metastatic disease    Social  History   Social History  . Marital Status: Married    Spouse Name: N/A  . Number of Children: N/A  . Years of Education: N/A   Occupational History  . Not on file.   Social History Main Topics  . Smoking status: Never Smoker   . Smokeless tobacco: Never Used  . Alcohol Use: Yes     Comment: rarely wine  . Drug Use: No  . Sexual Activity:    Partners: Male    Birth Control/ Protection: Surgical     Comment: husband had vasectomy/Novasure ablation 2012   Other Topics Concern  . Not on file   Social History Narrative    ROS:  Pertinent items are noted in HPI.  PHYSICAL EXAMINATION:    BP 126/80 mmHg  Pulse 70  Ht 5' 2.5" (1.588 m)  Wt 154 lb 6.4 oz (70.035 kg)  BMI 27.77 kg/m2    General appearance: alert, cooperative and appears stated age   Pelvic: External  genitalia:  no lesions              Urethra:  normal appearing urethra with no masses, tenderness or lesions              Bartholins and Skenes: normal                 Vagina: normal appearing vagina with normal color and discharge, no lesions              Cervix: no lesions and clear colorless discharge noted from the cervical os.        Bimanual Exam:  Uterus:  normal size, contour, position, consistency, mobility, non-tender              Adnexa: normal adnexa and no mass, fullness, tenderness             Chaperone was present for exam.  ASSESSMENT  Vaginal odor. Possible perimenopause.   PLAN  Counseled regarding vaginitis.  Discussion of options for tx of bacterial vaginosis - Metrogel. Flagyl, Tindamax, Clindamycin vaginal cream or ovules.  For suppression of infection, may do more prolonged tx with boric acid or Metrogel twice weekly for 4 - 6 months.  Will try corn starch for vulvar freshness due to increased sweating.    An After Visit Summary was printed and given to the patient.  __15____ minutes face to face time of which over 50% was spent in counseling.

## 2015-11-01 ENCOUNTER — Telehealth: Payer: Self-pay | Admitting: Obstetrics and Gynecology

## 2015-11-01 LAB — WET PREP BY MOLECULAR PROBE
Candida species: NEGATIVE
GARDNERELLA VAGINALIS: NEGATIVE
TRICHOMONAS VAG: NEGATIVE

## 2015-11-01 NOTE — Telephone Encounter (Signed)
Spoke with patient. Results given as seen below. Patient verbalizes understanding. States she would like to consider using boric acid or metrogel for a short period of time. Advised treatment is not indicated at this time as her results were negative. Patient states "I know the corn starch will only help for a certain amount of time. I am desperate to do something. I really thought I had a bacterial infection." Advised testing for BV is negative and treatment is not recommended. Patient would like for me to speak with Dr.Silva regarding her request. Advised I will speak with Dr.Silva and return call with additional recommendations. She is agreeable.  Notes Recorded by Patton Salles, MD on 11/01/2015 at 11:20 AM Please inform patient that her vaginitis testing is negative for infection.  There is no sign of bacterial vaginosis.  I am not recommending treatment at this time. We talked about her using corn starch externally to try to maintain some dryness and freshness. She felt like she is maintaining heat in the pelvic area.  Cc - Karina Ramirez

## 2015-11-01 NOTE — Telephone Encounter (Signed)
Patient is calling for her results from yesterday. She said she was "told to expect a call today."

## 2015-11-02 NOTE — Telephone Encounter (Signed)
I have done a literature search in Up to Date to review options for the patient to reduce vaginal odor in the presence of negative vaginitis testing.  There is data to support doing tap water douches daily for one month using "Waterworks", which is a stainless steel douching device.  This has been shown to reduce odor successfully.  The patient can check her local pharmacy or [employer] for purchasing of the [facility] system.

## 2015-11-03 NOTE — Telephone Encounter (Signed)
Spoke with patient. Advised of message and recommendation as seen below from Dr.Silva. She is agreeable and verbalizes understanding.  Routing to provider for final review. Patient agreeable to disposition. Will close encounter.

## 2015-11-07 ENCOUNTER — Ambulatory Visit: Payer: BLUE CROSS/BLUE SHIELD | Admitting: Podiatry

## 2015-11-07 NOTE — Telephone Encounter (Signed)
Karina LenzHey Sally could you close encounter please?

## 2015-11-14 ENCOUNTER — Encounter: Payer: Self-pay | Admitting: Podiatry

## 2015-11-14 ENCOUNTER — Ambulatory Visit (INDEPENDENT_AMBULATORY_CARE_PROVIDER_SITE_OTHER): Payer: BLUE CROSS/BLUE SHIELD | Admitting: Podiatry

## 2015-11-14 VITALS — BP 125/78 | HR 69 | Resp 12

## 2015-11-14 DIAGNOSIS — M722 Plantar fascial fibromatosis: Secondary | ICD-10-CM

## 2015-11-14 NOTE — Progress Notes (Signed)
She presents today for follow-up of her plantar fasciitis bilaterally. States the left foot is 100% improve the right foot however is only about 80% improved. She continues her meloxicam and her other medications and conservative therapies.  Objective: Vital signs are stable alert and oriented 3. Pulses are palpable. She has pain on palpation medially continue to go the right heel none on the left heel. No calf pain. Pulses remain palpable.  Assessment: Plantar fasciitis 80% resolved right foot. 100% resolved left foot.  Plan: Injected the right heel once again today and recommended that she continue all other conservative therapies including plantar fascial brace night splint shoe gear changes and we will consider orthotics next time. She will also consider her medication to be ongoing for this time.

## 2015-12-19 ENCOUNTER — Encounter: Payer: Self-pay | Admitting: Podiatry

## 2015-12-19 ENCOUNTER — Ambulatory Visit (INDEPENDENT_AMBULATORY_CARE_PROVIDER_SITE_OTHER): Payer: BLUE CROSS/BLUE SHIELD | Admitting: Podiatry

## 2015-12-19 VITALS — BP 120/71 | HR 74 | Resp 16

## 2015-12-19 DIAGNOSIS — M722 Plantar fascial fibromatosis: Secondary | ICD-10-CM | POA: Diagnosis not present

## 2015-12-19 NOTE — Progress Notes (Signed)
She presents today for follow-up of bilateral plantar fasciitis. She states that she is doing much better as far as the left foot does however the right foot has regressed recently. She states that she had a few days last week with sharp stabbing pains in the plantar aspect of the right heel. Left foot is 100% improved.  Objective: Vital signs are stable she is alert and oriented 3 presents today with flip flops in. Pulses are strongly palpable. Reproducible pain to the central band of the plantar fascia of the right heel. The majority of the previous pain was on the medial band.  Assessment: Plantar fasciitis right 80% resolved left 100% resolved.  Plan: I reinjected the area today at her request with Kenalog and local anesthetic right heel only. I will follow-up with her in 6 weeks

## 2016-01-16 ENCOUNTER — Ambulatory Visit: Payer: BLUE CROSS/BLUE SHIELD | Admitting: Podiatry

## 2016-02-24 ENCOUNTER — Other Ambulatory Visit: Payer: Self-pay | Admitting: Podiatry

## 2016-03-01 ENCOUNTER — Telehealth: Payer: Self-pay | Admitting: *Deleted

## 2016-03-01 NOTE — Telephone Encounter (Addendum)
Pt states she is continuing to have pain in her heels, and wanted to know if Dr. Al CorpusHyatt could give her more injections or change her medication. I told pt she had not been see since 12/2015 and Dr. Al CorpusHyatt would want to see her to reevaluate.  Transferred pt to schedulers. 03/02/2016-Pt states she is very aggravated that she has been scheduled out 3 weeks and wants to know if she can get in sooner.  I spoke with pt and told her i would transfer to schedulers to see if we have any cancellations, but before then I would give some comfort measures. I instructed pt to ice 3-4 times daily for 10-15 minutes each time, take the antiinflammatory OTC medication she was able to tolerate as the package directs.

## 2016-03-13 ENCOUNTER — Other Ambulatory Visit: Payer: Self-pay | Admitting: Obstetrics and Gynecology

## 2016-03-13 NOTE — Telephone Encounter (Signed)
Medication refill request: CAMILA 0.35mg  Last AEX:  06/29/15 Dr. Edward JollySilva Next AEX: 07/13/16  Last MMG (if hormonal medication request): 06/16/15 BIRADS1 negative Refill authorized: 07/21/15 #3packs w/2 refills; today #3packs w/1 refill?

## 2016-03-16 ENCOUNTER — Ambulatory Visit (INDEPENDENT_AMBULATORY_CARE_PROVIDER_SITE_OTHER): Payer: BLUE CROSS/BLUE SHIELD | Admitting: Sports Medicine

## 2016-03-16 ENCOUNTER — Encounter: Payer: Self-pay | Admitting: Sports Medicine

## 2016-03-16 DIAGNOSIS — M722 Plantar fascial fibromatosis: Secondary | ICD-10-CM

## 2016-03-16 DIAGNOSIS — M79671 Pain in right foot: Secondary | ICD-10-CM

## 2016-03-16 MED ORDER — METHYLPREDNISOLONE 4 MG PO TBPK: ORAL_TABLET | ORAL | Status: DC

## 2016-03-16 MED ORDER — TRIAMCINOLONE ACETONIDE 10 MG/ML IJ SUSP: 10.0000 mg | Freq: Once | INTRAMUSCULAR | Status: DC

## 2016-03-16 NOTE — Patient Instructions (Signed)

## 2016-03-17 NOTE — Progress Notes (Signed)
Patient ID: Karina Ramirez, female   DOB: 1971-01-15, 45 y.o.   MRN: [id number] Subjective: Karina Ramirez is a 45 y.o. female returns to office for follow up evaluation after Right heel injection for plantar fasciitis. Patient reports that she has had a few injections in the heel with Dr. Al Corpus and that pain keeps coming back on the right. Left heel remains resolved. Admits that she was started on Celebrex for arthritis with a little relief but her right heel still hurts with first few steps. Patient denies any other pedal complaints.   Patient Active Problem List   Diagnosis Date Noted  . Chronic vaginitis 08/14/2013    Current Outpatient Prescriptions on File Prior to Visit  Medication Sig Dispense Refill  . ALPRAZolam (XANAX) 0.5 MG tablet as needed.  0  . atenolol (TENORMIN) 25 MG tablet Take 25 mg by mouth daily.    . Calcium-Vitamin D (CALTRATE 600 PLUS-VIT D PO) Take 1 tablet by mouth daily.      Marland Kitchen CAMILA 0.35 MG tablet TAKE 1 TABLET (0.35 MG TOTAL) BY MOUTH DAILY. 84 tablet 1  . cyclobenzaprine (FLEXERIL) 10 MG tablet TAKE 1 TABLET BY ORAL ROUTE AT NIGHT.  2  . DULoxetine (CYMBALTA) 20 MG capsule Take 20 mg by mouth 2 (two) times daily.  11  . fluticasone (FLONASE) 50 MCG/ACT nasal spray Place 2 sprays into the nose daily.    Marland Kitchen loratadine (CLARITIN) 10 MG tablet Take 10 mg by mouth daily.    Marland Kitchen LYRICA 75 MG capsule 2 (two) times daily.  1  . meloxicam (MOBIC) 15 MG tablet Take 1 tablet (15 mg total) by mouth daily. 30 tablet 3  . Multiple Vitamins-Minerals (MULTIVITAMIN WITH MINERALS) tablet Take 1 tablet by mouth daily.      . NONFORMULARY OR COMPOUNDED ITEM Place one suppository in the vagina once a week at bedtime. Do this for the next 15 weeks. 15 each 0   No current facility-administered medications on file prior to visit.    Allergies  Allergen Reactions  . Erythromycin Hives  . Tolectin [Tolmetin Sodium] Nausea And Vomiting    Objective:   General:  Alert and oriented x  3, in no acute distress  Dermatology: Skin is warm, dry, and supple bilateral. Nails are within normal limits. There is no lower extremity erythema, no eccymosis, no open lesions present bilateral.   Vascular: Dorsalis Pedis and Posterior Tibial pedal pulses are 2/4 bilateral. + hair growth noted bilateral. Capillary Fill Time is 3 seconds in all digits. No varicosities, No edema bilateral lower extremities.   Neurological: Sensation grossly intact to light touch with an achilles reflex of +2 and a  negative Tinel's sign bilateral. Vibratory, sharp/dull, Semmes Weinstein Monofilament within normal limits.   Musculoskeletal: There is tenderness to palpation at the medial calcaneal tubercale and through the insertion of the plantar fascia at medial and central bands on the right foot. No pain with compression to calcaneus or application of tuning fork. There is decreased Ankle joint range of motion bilateral. All other joints range of motion  within normal limits bilateral. Strength 5/5 bilateral.   Assessment and Plan: Problem List Items Addressed This Visit    None    Visit Diagnoses    Plantar fasciitis, right    -  Primary    Relevant Medications    methylPREDNISolone (MEDROL DOSEPAK) 4 MG TBPK tablet    triamcinolone acetonide (KENALOG) 10 MG/ML injection 10 mg    Right foot  pain          -Complete examination performed.  -Previous x-rays reviewed. -Discussed with patient in detail the condition of plantar fasciitis, how this  occurs related to the foot type of the patient and general treatment options. - Patient opted for another injection today; After oral consent and aseptic prep, injected a mixture containing 1ml of 1%plain lidocaine, 1 ml 0.5% plain marcaine, 0.5 ml of kenalog and 0.5 ml of dexmethasone phosphate to right heel via plantar approach at area of most pain/trigger point injection to see if this would offer better relief. -Rx Medrol dose pack to take as  instructed -Continue celebrex  -Continue with fascial brace, night splint, stretching, icing, good supportive shoes, inserts daily. Advised patient to refrain from flip flops and non-supportive shoes -Discussed long term care and reocurrence; will closely monitor; if fails to improve will consider other treatment modalities. Advised patient if re-curs to consider MRI.  -Patient to return to office in 3-4 weeks for follow up or sooner if problems or questions arise.  Asencion Islam, DPM

## 2016-04-06 ENCOUNTER — Ambulatory Visit: Payer: BLUE CROSS/BLUE SHIELD | Admitting: Sports Medicine

## 2016-04-25 ENCOUNTER — Telehealth: Payer: Self-pay | Admitting: *Deleted

## 2016-04-25 ENCOUNTER — Ambulatory Visit (INDEPENDENT_AMBULATORY_CARE_PROVIDER_SITE_OTHER): Payer: BLUE CROSS/BLUE SHIELD | Admitting: Podiatry

## 2016-04-25 ENCOUNTER — Encounter: Payer: Self-pay | Admitting: Podiatry

## 2016-04-25 DIAGNOSIS — M722 Plantar fascial fibromatosis: Secondary | ICD-10-CM | POA: Diagnosis not present

## 2016-04-25 NOTE — Progress Notes (Signed)
She presents today for follow-up of bilateral plantar fasciitis. She states that her right heel is just not getting any better even after injections one month ago. She states this just not responding any longer and it hurts. Her left heel she has pain on palpation and with shoe gear.  Objective: Vital signs stable alert and 3 pulses are palpable bilateral. No calf pain. She has pain on palpation medial trochanter, bilateral heels right greater than left.  Assessment: Well-healing plantar fasciitis left nonhealing plantar fasciitis right.  Plan: Reinjected the left heel today requesting MRI for the right heel considering surgical intervention.

## 2016-04-25 NOTE — Telephone Encounter (Addendum)
-----   Message from Kristian CoveyAshley E Prevette, [facility] sent at 04/25/2016  2:21 PM EDT ----- Regarding: MRI Orders are in! She is a [facility] pt but want this scheduled at [facility]. Thanks!! Orders given to [facility] [facility] for [employer]. 05/03/2016-[employer] APPROVED MRI [id number] RIGHT FOOT, CASE #[id number], VALID 05/03/2016 - 06/01/2016.FAXED TO [facility].

## 2016-05-04 ENCOUNTER — Other Ambulatory Visit: Payer: BLUE CROSS/BLUE SHIELD

## 2016-05-15 ENCOUNTER — Inpatient Hospital Stay: Admission: RE | Admit: 2016-05-15 | Payer: BLUE CROSS/BLUE SHIELD | Source: Ambulatory Visit

## 2016-07-12 NOTE — Progress Notes (Signed)
45 y.o. 532P2002 Married Caucasian female here for annual exam.    On Micronor for cycle control. Occasional spotting.  Wants to continue.   Really bothered by vaginal odor chronically.  Tried a tap water douche.  No help. BV testing has been negative for quite a long time.  Occ urge incontinence.  Having joint pain and has a positive CRP.  Seeing rheumatology.  Dealing with plantar fasciitis and having steroid injections in her knees and even feet.  This is helping.   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  PCP:  Dr. Donette LarryHusain  No LMP recorded. Patient has had an ablation.           Sexually active: Yes.    The current method of family planning is progestin only pill. Exercising: No.  The patient does not participate in regular exercise at present. Smoker:  no  Health Maintenance: Pap:  06/29/15, Negative with neg HR HPV,  Pap 06/23/14 - negative and negative HR HPV. History of abnormal Pap:  Yes, 07/2013, low grade dysplasia MMG:  06/16/15, Bi-Rads 1:  Negative, scheduled 07/2016 Colonoscopy:  N/A BMD:   N/A  Result  N/A TDaP:  07/2012 Gardasil:   no HIV: 06/29/15 Hep C: N/A Screening Labs: PCP,  Hb today: declined, Urine today: declined   reports that she has never smoked. She has never used smokeless tobacco. She reports that she drinks alcohol. She reports that she does not use drugs.  Past Medical History:  Diagnosis Date  . Anxiety   . Carpal tunnel syndrome, bilateral   . Headache(784.0)    not any longer  . Hyperprolactinemia [facility](HCC)     Past Surgical History:  Procedure Laterality Date  . BLADDER SUSPENSION  05/24/2011   Procedure: TRANSVAGINAL TAPE (TVT) PROCEDURE;  Surgeon: Melony OverlyBrook A Silva;  Location: [facility];  Service: Gynecology;  Laterality: N/A;  with Cystoscopy  . CYSTOCELE REPAIR  05/24/2011   Procedure: ANTERIOR REPAIR (CYSTOCELE);  Surgeon: Melony OverlyBrook A Silva;  Location: [facility];  Service: Gynecology;  Laterality: N/A;  . NASAL  SINUS SURGERY     30 yrs ago  . NOVASURE ABLATION  05/24/2011   Procedure: NOVASURE ABLATION;  Surgeon: Melony OverlyBrook A Silva;  Location: [facility];  Service: Gynecology;  Laterality: N/A;    Current Outpatient Prescriptions  Medication Sig Dispense Refill  . ALPRAZolam (XANAX) 0.5 MG tablet as needed.  0  . atenolol (TENORMIN) 25 MG tablet Take 25 mg by mouth daily.    . Calcium-Vitamin D (CALTRATE 600 PLUS-VIT D PO) Take 1 tablet by mouth daily.      Marland Kitchen. CAMILA 0.35 MG tablet TAKE 1 TABLET (0.35 MG TOTAL) BY MOUTH DAILY. 84 tablet 1  . cyclobenzaprine (FLEXERIL) 10 MG tablet TAKE 1 TABLET BY ORAL ROUTE AT NIGHT.  2  . DULoxetine (CYMBALTA) 60 MG capsule Take 60 mg by mouth daily.  3  . fluticasone (FLONASE) 50 MCG/ACT nasal spray Place 2 sprays into the nose daily.    Marland Kitchen. loratadine (CLARITIN) 10 MG tablet Take 10 mg by mouth daily.    Marland Kitchen. LYRICA 75 MG capsule 2 (two) times daily.  1  . Multiple Vitamins-Minerals (MULTIVITAMIN WITH MINERALS) tablet Take 1 tablet by mouth daily.      . NONFORMULARY OR COMPOUNDED ITEM Place one suppository in the vagina once a week at bedtime. Do this for the next 15 weeks. 15 each 0   Current Facility-Administered Medications  Medication Dose Route Frequency Provider Last  Rate Last Dose  . triamcinolone acetonide (KENALOG) 10 MG/ML injection 10 mg  10 mg Other Once Asencion Islamitorya Stover, DPM        Family History  Problem Relation Age of Onset  . Breast cancer Maternal Aunt     metastatic disease    ROS:  Pertinent items are noted in HPI.  Otherwise, a comprehensive ROS was negative.  Exam:   BP 120/74 (BP Location: Right Arm, Patient Position: Sitting, Cuff Size: Normal)   Pulse 68   Ht 5' 2.5" (1.588 m)   Wt 161 lb (73 kg)   BMI 28.98 kg/m     General appearance: alert, cooperative and appears stated age Head: Normocephalic, without obvious abnormality, atraumatic Neck: no adenopathy, supple, symmetrical, trachea midline and thyroid normal to inspection and  palpation Lungs: clear to auscultation bilaterally Breasts: normal appearance, no masses or tenderness, No nipple retraction or dimpling, No nipple discharge or bleeding, No axillary or supraclavicular adenopathy Heart: regular rate and rhythm Abdomen: soft, non-tender; no masses, no organomegaly Extremities: extremities normal, atraumatic, no cyanosis or edema Skin: Skin color, texture, turgor normal. No rashes or lesions Lymph nodes: Cervical, supraclavicular, and axillary nodes normal. No abnormal inguinal nodes palpated Neurologic: Grossly normal  Pelvic: External genitalia:  no lesions              Urethra:  normal appearing urethra with no masses, tenderness or lesions              Bartholins and Skenes: normal                 Vagina: normal appearing vagina with normal color and discharge, no lesions              Cervix: no lesions              Pap taken: Yes.   Bimanual Exam:  Uterus:  normal size, contour, position, consistency, mobility, non-tender              Adnexa: no mass, fullness, tenderness              Rectal exam: No..  Confirms.              Anus:  normal sphincter tone, no lesions  Chaperone was present for exam.  Assessment:   Well woman visit with normal exam. Status post ablation.  Micronor for cycle control. Hx hyperprolactinemia.  Hx LGSIL. Chronic perceived vaginal odor.   Plan: Yearly mammogram recommended after age 45.  Recommended self breast exam.  Pap and HR HPV as above. Guidelines for  Calcium, Vitamin D, regular exercise program including cardiovascular and weight bearing exercise. Prolactin level.  Metrogel pv once each month after menses. Dispense 70 gram, RF one.  RF Micronor for one year. Follow up annually and prn.       After visit summary provided.

## 2016-07-13 ENCOUNTER — Encounter: Payer: Self-pay | Admitting: Obstetrics and Gynecology

## 2016-07-13 ENCOUNTER — Ambulatory Visit (INDEPENDENT_AMBULATORY_CARE_PROVIDER_SITE_OTHER): Payer: BLUE CROSS/BLUE SHIELD | Admitting: Obstetrics and Gynecology

## 2016-07-13 VITALS — BP 120/74 | HR 68 | Ht 62.5 in | Wt 161.0 lb

## 2016-07-13 DIAGNOSIS — Z8639 Personal history of other endocrine, nutritional and metabolic disease: Secondary | ICD-10-CM

## 2016-07-13 DIAGNOSIS — Z01419 Encounter for gynecological examination (general) (routine) without abnormal findings: Secondary | ICD-10-CM

## 2016-07-13 DIAGNOSIS — N898 Other specified noninflammatory disorders of vagina: Secondary | ICD-10-CM

## 2016-07-13 MED ORDER — METRONIDAZOLE 0.75 % VA GEL: 1.0000 | Freq: Every day | VAGINAL | 1 refills | Status: DC

## 2016-07-13 MED ORDER — CAMILA 0.35 MG PO TABS: ORAL_TABLET | ORAL | 3 refills | Status: DC

## 2016-07-13 NOTE — Patient Instructions (Signed)

## 2016-07-14 LAB — PROLACTIN: PROLACTIN: 20.7 ng/mL

## 2016-07-17 LAB — IPS PAP TEST WITH HPV

## 2016-07-18 ENCOUNTER — Telehealth: Payer: Self-pay

## 2016-07-18 NOTE — Telephone Encounter (Signed)
If she was spotting the few days around her pap, then I think it is fine. Otherwise she should f/u with Dr Edward JollySilva when she returns. She should calendar any bleeding for 3 months and send it in for review with Dr Edward JollySilva.

## 2016-07-18 NOTE — Telephone Encounter (Signed)
-----   Message from Romualdo BolkJill Evelyn Jertson, MD sent at 07/17/2016  4:52 PM EST ----- The patients pap was negative, with negative hpv, but it did have endometrial cells. The patient has had an endometrial ablation and has spotting on micronor. Please check when the patient last had spotting and check on how often she is spotting, depending on her bleeding she may need further evaluation (or not).

## 2016-07-18 NOTE — Telephone Encounter (Signed)
Spoke with patient. Advised of results as seen below from Dr.Jertson. Patient verbalizes understanding. Reports she has two days of light spotting last week. Is still taking Camila daily. Denies missing any pills or taking any pills late. "I am not having the bleeding often at all. It is not even every month." Unable to give RN exact time frame of how often she is spotting. Advised I will review with Dr.Jertson who is covering for Dr.Silva and return call with further recommendations. Patient is agreeable.

## 2016-07-19 NOTE — Telephone Encounter (Signed)
Left message to call Semisi Biela at [phone]. 

## 2016-07-19 NOTE — Telephone Encounter (Signed)
I think there is a misunderstanding. If she was bleeding right before or after her pap she is likely fine. She is welcome to come discuss with Dr Edward JollySilva. Other wise I would just calendar her cycles as previously recommended.

## 2016-07-19 NOTE — Telephone Encounter (Signed)
Spoke with patient. Patient states that she had spotting prior to having her pap smear. Advised she will need t be seen in the office with Dr.Silva for further evaluation when she returns. Patient is agreeable. Appointment scheduled for 07/30/2016 at 11:30 am with Dr.Silva. Patient is agreeable to date and time.  Cc: Dr.Silva  Routing to covering provider for final review. Patient agreeable to disposition. Will close encounter.

## 2016-07-20 NOTE — Telephone Encounter (Signed)
Spoke with patient. Patient states that her bleeding occurred 1-2 weeks before her pap smear. Patient is unsure of the exact date. Patient states that she will feel more comfortable coming in to see Dr.Silva for follow up. Will keep appointment as scheduled for 07/30/2016 at 11:30 am with Dr.Silva. Patient is agreeable to date and time.  Cc: Dr.Silva  Routing to covering  provider for final review. Patient agreeable to disposition. Will close encounter.

## 2016-07-30 ENCOUNTER — Other Ambulatory Visit: Payer: BLUE CROSS/BLUE SHIELD

## 2016-07-30 ENCOUNTER — Ambulatory Visit (INDEPENDENT_AMBULATORY_CARE_PROVIDER_SITE_OTHER): Payer: BLUE CROSS/BLUE SHIELD | Admitting: Obstetrics and Gynecology

## 2016-07-30 ENCOUNTER — Encounter: Payer: Self-pay | Admitting: Obstetrics and Gynecology

## 2016-07-30 ENCOUNTER — Telehealth: Payer: Self-pay | Admitting: *Deleted

## 2016-07-30 VITALS — BP 110/72 | HR 64 | Ht 62.5 in

## 2016-07-30 DIAGNOSIS — N926 Irregular menstruation, unspecified: Secondary | ICD-10-CM

## 2016-07-30 DIAGNOSIS — N898 Other specified noninflammatory disorders of vagina: Secondary | ICD-10-CM

## 2016-07-30 DIAGNOSIS — R87618 Other abnormal cytological findings on specimens from cervix uteri: Secondary | ICD-10-CM

## 2016-07-30 NOTE — Progress Notes (Signed)
GYNECOLOGY  VISIT   HPI: 45 y.o.   Married  Caucasian  female   G2P2002 with Patient's last menstrual period was 07/25/2016 (exact date).   here for irregular spotting and patient concerned about endometrial cells on pap smear.   Pap 07/13/16 - normal and negative HR HPV.  LMP was documented to be 07/06/16.  This was for one day. Spotted on 07/25/16 for one day.   On [name].  Takes the pill late once per month.  Missed a pill once or twice this month. Husband had vasectomy.  Patient has had ablation.   Had pelvic ultrasound 05/07/13 for metrorrhagia and had arcuate uterus, EMS 4.71 mm, and normal ovaries.  EMB done and showed benign endocervical mucosa and rare fragments of LUS.   Very concerned about vaginal odor.  Has repeated negative Affirm.  Asking about doing an empiric course of Metrogel.  Asking if the endometrial ablation could be the cause of the odor.  GYNECOLOGIC HISTORY: Patient's last menstrual period was 07/25/2016 (exact date). Contraception:  OCP--[name], vasectomy.  Menopausal hormone therapy:  n/a Last mammogram: 06/16/15, Bi-Rads 1:  Negative, scheduled 07/2016  Last pap smear: 07-13-16 Neg:Neg HR HPV--endometrial cells present        OB History    Gravida Para Term Preterm AB Living   2 2 2  0 0 2   SAB TAB Ectopic Multiple Live Births   0 0 0 0 2         Patient Active Problem List   Diagnosis Date Noted  . Chronic vaginitis 08/14/2013    Past Medical History:  Diagnosis Date  . Anxiety   . Carpal tunnel syndrome, bilateral   . Headache(784.0)    not any longer  . Hyperprolactinemia [facility](HCC)     Past Surgical History:  Procedure Laterality Date  . BLADDER SUSPENSION  05/24/2011   Procedure: TRANSVAGINAL TAPE (TVT) PROCEDURE;  Surgeon: Melony OverlyBrook A Silva;  Location: [facility];  Service: Gynecology;  Laterality: N/A;  with Cystoscopy  . CYSTOCELE REPAIR  05/24/2011   Procedure: ANTERIOR REPAIR (CYSTOCELE);  Surgeon: Melony OverlyBrook A Silva;  Location: [facility];   Service: Gynecology;  Laterality: N/A;  . NASAL SINUS SURGERY     30 yrs ago  . NOVASURE ABLATION  05/24/2011   Procedure: NOVASURE ABLATION;  Surgeon: Melony OverlyBrook A Silva;  Location: [facility];  Service: Gynecology;  Laterality: N/A;    Current Outpatient Prescriptions  Medication Sig Dispense Refill  . ALPRAZolam (XANAX) 0.5 MG tablet as needed.  0  . atenolol (TENORMIN) 25 MG tablet Take 25 mg by mouth daily.    . Calcium-Vitamin D (CALTRATE 600 PLUS-VIT D PO) Take 1 tablet by mouth daily.      Marland Kitchen. CAMILA 0.35 MG tablet TAKE 1 TABLET (0.35 MG TOTAL) BY MOUTH DAILY. 84 tablet 3  . cyclobenzaprine (FLEXERIL) 10 MG tablet TAKE 1 TABLET BY ORAL ROUTE AT NIGHT.  2  . DULoxetine (CYMBALTA) 60 MG capsule Take 60 mg by mouth daily.  3  . fluticasone (FLONASE) 50 MCG/ACT nasal spray Place 2 sprays into the nose daily.    Marland Kitchen. loratadine (CLARITIN) 10 MG tablet Take 10 mg by mouth daily.    Marland Kitchen. LYRICA 75 MG capsule 2 (two) times daily.  1  . metroNIDAZOLE (METROGEL) 0.75 % vaginal gel Place 1 Applicatorful vaginally at bedtime. Once one applicator once a month after menses. 70 g 1  . Multiple Vitamins-Minerals (MULTIVITAMIN WITH MINERALS) tablet Take 1 tablet by mouth daily.      .Marland Kitchen  NONFORMULARY OR COMPOUNDED ITEM Place one suppository in the vagina once a week at bedtime. Do this for the next 15 weeks. 15 each 0   Current Facility-Administered Medications  Medication Dose Route Frequency Provider Last Rate Last Dose  . triamcinolone acetonide (KENALOG) 10 MG/ML injection 10 mg  10 mg Other Once Asencion Islamitorya Stover, DPM         ALLERGIES: Erythromycin and Tolectin [tolmetin sodium]  Family History  Problem Relation Age of Onset  . Breast cancer Maternal Aunt     metastatic disease    Social History   Social History  . Marital status: Married    Spouse name: N/A  . Number of children: N/A  . Years of education: N/A   Occupational History  . Not on file.   Social History Main Topics  . Smoking  status: Never Smoker  . Smokeless tobacco: Never Used  . Alcohol use 0.0 oz/week     Comment: rarely wine  . Drug use: No  . Sexual activity: Yes    Partners: Male    Birth control/ protection: Surgical     Comment: husband had vasectomy/Novasure ablation 2012/OCPs-[name]   Other Topics Concern  . Not on file   Social History Narrative  . No narrative on file    ROS:  Pertinent items are noted in HPI.  PHYSICAL EXAMINATION:    BP 110/72 (BP Location: Right Arm, Patient Position: Sitting, Cuff Size: Normal)   Pulse 64   Ht 5' 2.5" (1.588 m)   LMP 07/25/2016 (Exact Date)     General appearance: alert, cooperative and appears stated age   Pelvic: External genitalia:  no lesions              Urethra:  normal appearing urethra with no masses, tenderness or lesions              Bartholins and Skenes: normal                 Vagina: normal appearing vagina with normal color and discharge, no lesions              Cervix: no lesions.  Small trickle of blood from the cervical os.  Patient unaware of bleeding today.                 Bimanual Exam:  Uterus:  normal size, contour, position, consistency, mobility, non-tender              Chaperone was present for exam.  ASSESSMENT  Missed [name] pills.  Endometrial cells on the pap, within cycle time.  Irregular spotting which may be under diagnosed. Hx endometrial abltion.  Vaginal odor.   PLAN  Discussed endometrial cells and proper evaluation.  Will return for sonohysterogram and endometrial biopsy.  This may or may not result in a successful tisssue sampling.  Will do Affirm today for objective evaluation for vaginitis. I don't recommend doing an empiric course of Metrogel as her testing has been repeatedly negative.  She is already doing one Metrogel application a month following menses.  An After Visit Summary was printed and given to the patient.  __25____ minutes face to face time of which over 50% was spent in  counseling.

## 2016-07-30 NOTE — Telephone Encounter (Signed)
"  We have a patient scheduled for Tuesday November 27th.  She has [employer].  She had authorization back in August but it expired the end of September.  Looks like she rescheduled so she needs a new authorization.

## 2016-07-30 NOTE — Telephone Encounter (Signed)
Her [employer] needs authorization.  We will not be open on Friday.  Do we need to reschedule her appointment?  Please give me a call back.

## 2016-07-31 LAB — WET PREP BY MOLECULAR PROBE
Candida species: POSITIVE — AB
GARDNERELLA VAGINALIS: NEGATIVE
Trichomonas vaginosis: NEGATIVE

## 2016-08-01 ENCOUNTER — Other Ambulatory Visit: Payer: Self-pay | Admitting: *Deleted

## 2016-08-01 ENCOUNTER — Telehealth: Payer: Self-pay | Admitting: *Deleted

## 2016-08-01 MED ORDER — FLUCONAZOLE 150 MG PO TABS: 150.0000 mg | ORAL_TABLET | Freq: Once | ORAL | 0 refills | Status: AC

## 2016-08-01 NOTE — Telephone Encounter (Signed)
-----   Message from Patton SallesBrook E [name], MD sent at 08/01/2016 12:38 PM EST ----- Please inform patient of her Affirm showing yeast and not bacterial vaginosis.  I recommend treating with the Diflucan 150 mg po x 1.  May repeat in 72 hours if needed.  Dispense 2, RF none.  Please send to pharmacy of choice.   I don't recommend a prophylactic course of the Metrogel.   Cc- Claudette LawsAmanda Dixon

## 2016-08-01 NOTE — Telephone Encounter (Signed)
Spoke with patient to review lab results as seen below. Patient states she was to have another procedure or testing done and wanted to know when/who will set that up? Advised will review with Dr. Edward JollySilva and return call. Advised patient Dr. Edward JollySilva is seeing patients, response may not be immediate. Patient is agreeable.  Dr. Edward JollySilva, please advise?    Notes Recorded by Leda MinJill N Lavontay Kirk, RN on 08/01/2016 at 2:54 PM EST Spoke with patient, advised as seen below per Dr. Edward JollySilva. Patient verbalizes understanding and is agreeable. Order placed for Diflucan 150 mg po x1. May repeat in 72 hrs if needed. #2/0RF. -jh ------

## 2016-08-01 NOTE — Telephone Encounter (Signed)
I placed an order for the sonohysterogram and endometrial biopsy with me for abnormal menses and endometrial cells on pap smear.

## 2016-08-02 NOTE — Telephone Encounter (Signed)
Spoke with patient, advised as seen below per Dr. Edward JollySilva. Patient scheduled for sonohysterogram and EMB 08/09/16 at 0800 with Dr. Edward JollySilva. Patient advised Motrin 800 mg with food and water one hour before procedure. Patient verbalizes understanding and is agreeable.  Routing to provider for final review. Patient is agreeable to disposition. Will close encounter.

## 2016-08-02 NOTE — Telephone Encounter (Signed)
I called and left Alvino Chapelllen a message that patient's MRI was authorized.  The authorization number is [id number] and it is good from 08/01/2016 to 08/30/2016.

## 2016-08-03 ENCOUNTER — Telehealth: Payer: Self-pay | Admitting: Obstetrics and Gynecology

## 2016-08-03 NOTE — Telephone Encounter (Signed)
Patient calling to check benefits for her ultrasound scheduled on Thursday.

## 2016-08-06 NOTE — Telephone Encounter (Signed)
Spoke with patient regarding benefit for sonohysterogram. Patient understood and agreeable. Patient has been scheduled for 08/09/16 with Dr Edward JollySIlva. Patient is aware of arrival date, time and cancellation policy. No further questions. Ok to close

## 2016-08-09 ENCOUNTER — Ambulatory Visit (INDEPENDENT_AMBULATORY_CARE_PROVIDER_SITE_OTHER): Payer: BLUE CROSS/BLUE SHIELD | Admitting: Obstetrics and Gynecology

## 2016-08-09 ENCOUNTER — Encounter: Payer: Self-pay | Admitting: Obstetrics and Gynecology

## 2016-08-09 ENCOUNTER — Other Ambulatory Visit: Payer: BLUE CROSS/BLUE SHIELD

## 2016-08-09 ENCOUNTER — Other Ambulatory Visit: Payer: Self-pay | Admitting: Obstetrics and Gynecology

## 2016-08-09 ENCOUNTER — Other Ambulatory Visit: Payer: BLUE CROSS/BLUE SHIELD | Admitting: Obstetrics and Gynecology

## 2016-08-09 ENCOUNTER — Ambulatory Visit (INDEPENDENT_AMBULATORY_CARE_PROVIDER_SITE_OTHER): Payer: BLUE CROSS/BLUE SHIELD

## 2016-08-09 VITALS — BP 122/74 | HR 76 | Ht 62.5 in | Wt 160.0 lb

## 2016-08-09 DIAGNOSIS — N926 Irregular menstruation, unspecified: Secondary | ICD-10-CM | POA: Diagnosis not present

## 2016-08-09 DIAGNOSIS — R87618 Other abnormal cytological findings on specimens from cervix uteri: Secondary | ICD-10-CM

## 2016-08-09 NOTE — Progress Notes (Signed)
GYNECOLOGY  VISIT   HPI: 45 y.o.   Married  Caucasian  female   G2P2002 with Patient's last menstrual period was 07/25/2016 (exact date).   here for  Breakthrough bleeding on [name].  Some late pills.  Had surprise vaginal bleeding at office visit on 07/30/16.   Hx endometrial ablation.   Had pelvic ultrasound 05/07/13 for metrorrhagia and had arcuate uterus, EMS 4.71 mm, and normal ovaries.  EMB done and showed benign endocervical mucosa and rare fragments of LUS.   GYNECOLOGIC HISTORY: Patient's last menstrual period was 07/25/2016 (exact date). Contraception:  [name], vasectomy.  Last mammogram:  06/16/15 - 3D, [facility], BI-RADS-1. Last pap smear:   07-13-16 Neg:Neg HR HPV--endometrial cells present        OB History    Gravida Para Term Preterm AB Living   2 2 2  0 0 2   SAB TAB Ectopic Multiple Live Births   0 0 0 0 2         Patient Active Problem List   Diagnosis Date Noted  . Chronic vaginitis 08/14/2013    Past Medical History:  Diagnosis Date  . Anxiety   . Carpal tunnel syndrome, bilateral   . Headache(784.0)    not any longer  . Hyperprolactinemia [facility](HCC)     Past Surgical History:  Procedure Laterality Date  . BLADDER SUSPENSION  05/24/2011   Procedure: TRANSVAGINAL TAPE (TVT) PROCEDURE;  Surgeon: Melony OverlyBrook A Silva;  Location: [facility];  Service: Gynecology;  Laterality: N/A;  with Cystoscopy  . CYSTOCELE REPAIR  05/24/2011   Procedure: ANTERIOR REPAIR (CYSTOCELE);  Surgeon: Melony OverlyBrook A Silva;  Location: [facility];  Service: Gynecology;  Laterality: N/A;  . NASAL SINUS SURGERY     30 yrs ago  . NOVASURE ABLATION  05/24/2011   Procedure: NOVASURE ABLATION;  Surgeon: Melony OverlyBrook A Silva;  Location: [facility];  Service: Gynecology;  Laterality: N/A;    Current Outpatient Prescriptions  Medication Sig Dispense Refill  . ALPRAZolam (XANAX) 0.5 MG tablet as needed.  0  . atenolol (TENORMIN) 25 MG tablet Take 25 mg by mouth daily.    . Calcium-Vitamin D (CALTRATE 600 PLUS-VIT D PO)  Take 1 tablet by mouth daily.      Marland Kitchen. CAMILA 0.35 MG tablet TAKE 1 TABLET (0.35 MG TOTAL) BY MOUTH DAILY. 84 tablet 3  . cyclobenzaprine (FLEXERIL) 10 MG tablet TAKE 1 TABLET BY ORAL ROUTE AT NIGHT.  2  . DULoxetine (CYMBALTA) 60 MG capsule Take 60 mg by mouth daily.  3  . fluticasone (FLONASE) 50 MCG/ACT nasal spray Place 2 sprays into the nose daily.    Marland Kitchen. loratadine (CLARITIN) 10 MG tablet Take 10 mg by mouth daily.    Marland Kitchen. LYRICA 75 MG capsule 2 (two) times daily.  1  . metroNIDAZOLE (METROGEL) 0.75 % vaginal gel Place 1 Applicatorful vaginally at bedtime. Once one applicator once a month after menses. 70 g 1  . Multiple Vitamins-Minerals (MULTIVITAMIN WITH MINERALS) tablet Take 1 tablet by mouth daily.      . NONFORMULARY OR COMPOUNDED ITEM Place one suppository in the vagina once a week at bedtime. Do this for the next 15 weeks. 15 each 0   Current Facility-Administered Medications  Medication Dose Route Frequency Provider Last Rate Last Dose  . triamcinolone acetonide (KENALOG) 10 MG/ML injection 10 mg  10 mg Other Once Asencion Islamitorya Stover, DPM         ALLERGIES: Erythromycin and Tolectin [tolmetin sodium]  Family History  Problem Relation Age  of Onset  . Breast cancer Maternal Aunt     metastatic disease    Social History   Social History  . Marital status: Married    Spouse name: N/A  . Number of children: N/A  . Years of education: N/A   Occupational History  . Not on file.   Social History Main Topics  . Smoking status: Never Smoker  . Smokeless tobacco: Never Used  . Alcohol use 0.0 oz/week     Comment: rarely wine  . Drug use: No  . Sexual activity: Yes    Partners: Male    Birth control/ protection: Surgical     Comment: husband had vasectomy/Novasure ablation 2012/OCPs-[name]   Other Topics Concern  . Not on file   Social History Narrative  . No narrative on file    ROS:  Pertinent items are noted in HPI.  PHYSICAL EXAMINATION:    BP 122/74 (BP  Location: Right Arm, Patient Position: Sitting, Cuff Size: Normal)   Pulse 76   Ht 5' 2.5" (1.588 m)   Wt 160 lb (72.6 kg)   LMP 07/25/2016 (Exact Date)   BMI 28.80 kg/m     General appearance: alert, cooperative and appears stated age    Technique:  Both transabdominal and transvaginal ultrasound examinations of the pelvis were performed. Transabdominal technique was performed for global imaging of the pelvis including uterus, ovaries, adnexal regions, and pelvic cul-de-sac. It was necessary to proceed with endovaginal exam following the abdominal ultrasound.  Transabdominal exam to visualize the endometrium and adnexa.  Color and duplex Doppler ultrasound was utilized to evaluate blood flow to the ovaries.   Pelvic ultrasound:  Uterus with no masses.  EMS difficult to measure. 4.46 mm. No feeder vessel.  Ovaries: normal.  Left ovary 18 mm follicle. No free fluid:    Procedure - sonohysterogram Consent performed. Speculum placed in vagina. Sterile prep of cervix with Hibiclens. Cannula placed partially inside the endometrial cavity.  I feel scarring from the endometrial ablation limiting the placement of the canula. Speculum removed. Sterile saline injected.       No       filling defect noted but limited study.  Cannula removed. No complication.   Procedure - endometrial biopsy Consent performed. Speculum place in vagina.  Sterile prep of cervix with  Hibiclens. Tenaculum to anterior cervical lip.   Pipelle placed to  Almost 4  cm twice. Minimal tissue obtained and sent to pathology. Speculum removed.  No complications. Minimal EBL.  ASSESSMENT  Abnormal uterine bleeding.  Endometrial cells on pap, probable within cycle.  Status post endometrial ablation.  On [name].   PLAN  Discussion of limitations for evaluation following the endometrial ablation.  No obvious pathology.  Discussed options for care:  Stop [name] and observe cycles, Depo Provera,  hysterectomy.  I recommend stopping [name] after this pack.  Call in 2 -3 months with feedback.  Will contact patient with EMB results.  Instructions and precautions given.   An After Visit Summary was printed and given to the patient.  ___25___ minutes face to face time of which over 50% was spent in counseling.

## 2016-08-09 NOTE — Patient Instructions (Signed)

## 2016-08-09 NOTE — Progress Notes (Signed)
Encounter reviewed by Dr. Brook Amundson C. Silva.  

## 2016-08-12 ENCOUNTER — Inpatient Hospital Stay: Admission: RE | Admit: 2016-08-12 | Payer: BLUE CROSS/BLUE SHIELD | Source: Ambulatory Visit

## 2016-08-13 LAB — IPS OTHER TISSUE BIOPSY

## 2016-08-14 ENCOUNTER — Telehealth: Payer: Self-pay

## 2016-08-14 NOTE — Telephone Encounter (Signed)
Spoke with patient. Advised of results as seen below from Dr.Silva. Patient is agreeable and verbalizes understanding.  Notes Recorded by Patton SallesBrook E [name], MD on 08/13/2016 at 5:06 PM EST Please report to the patient her endometrial biopsy results which are showing benign endocervical tissue and no endometrial tissue.  We discussed that this may be likely the result due to her prior endometrial ablation.  We made a plan for her to stop her [name] and call back in 2 - 3 months to report her menstrual cycle pattern.  If she has irregular spotting occur, she needs to return for re-evaluation.   Cc- Claudette LawsAmanda Dixon  Routing to provider for final review. Patient agreeable to disposition. Will close encounter.

## 2016-08-15 ENCOUNTER — Encounter: Payer: Self-pay | Admitting: Obstetrics and Gynecology

## 2016-08-15 ENCOUNTER — Telehealth: Payer: Self-pay | Admitting: Obstetrics and Gynecology

## 2016-08-15 MED ORDER — FLUCONAZOLE 150 MG PO TABS: 150.0000 mg | ORAL_TABLET | Freq: Once | ORAL | 0 refills | Status: AC

## 2016-08-15 NOTE — Telephone Encounter (Signed)
Ok for Dilfucan 150 mg once.  Dispense 2, RF none.

## 2016-08-15 NOTE — Telephone Encounter (Signed)
Spoke with patient, advised as seen below per Dr. Silva. Patient verbalizes understanding and is agreeable.   Routing to provider for final review. Patient is agreeable to disposition. Will close encounter.  

## 2016-08-15 NOTE — Telephone Encounter (Signed)
Spoke with patient. Patient states she was treated for yeast infection that she initially did not realize she had. Patient reports taking Diflucan 11/30 and 12/3. Patient states she is still feeling a little irritated with a little vaginal itching. Denies vaginal discharge, odor, pain, fever, or urinary complaints. Patient states she really did not have any symptoms when she initially had yeast infection other than itching that got better with diflucan and now is itching again last few days. Patient states she would like another course of diflucan. Patient states she has been in for several visits with Dr. Edward JollySilva recently. Advised patient would review with Dr. Edward JollySilva for recommendations and return call. Patient is agreeable.  Dr. Edward JollySilva, please advise?

## 2016-08-15 NOTE — Telephone Encounter (Signed)
Patient was last seen 08/09/16 and "still has an infection". Patient is asking to talk with a nurse.

## 2016-08-16 ENCOUNTER — Telehealth: Payer: Self-pay | Admitting: *Deleted

## 2016-08-16 NOTE — Telephone Encounter (Signed)
"  I'm calling about Karina MulletJanet Ramirez.  She has canceled her MRI.  She wants to wait until 2018 when her deductible starts over.  The MRI will go toward her deductible.  Her authorization expires on December 26.  I just wanted to let you know. She said she would call your office when she's ready to reschedule.

## 2016-09-07 ENCOUNTER — Other Ambulatory Visit: Payer: Self-pay | Admitting: Obstetrics and Gynecology

## 2016-10-29 ENCOUNTER — Telehealth: Payer: Self-pay | Admitting: Obstetrics and Gynecology

## 2016-10-29 NOTE — Telephone Encounter (Signed)
Patient called and scheduled an appointment on 11/05/16 for a "possible yeast or bacterial infection."  She declined to schedule an appointment this week as she is "having eye surgery tomorrow."  Routing to triage for FYI only.  Patient aware to call back to be seen sooner if symptoms worsen.

## 2016-10-29 NOTE — Telephone Encounter (Signed)
Spoke with patient. Patient states she has been experiencing a little vaginal itching for the last month. Patient states she has had yeast in past and did not know. Patient states she is unsure if yeast or BV. Patient states she has slight odor, but not fishy, denies discharge, redness, fever or pelvic pain. Patient states she has eye surgery tomorrow would like to wait until next week to come in, is there anything in meantime I can try for relief? Recommended coconut externally for relief of itching and keep OV for further evaluation. Advised to return call if symptoms worsen, fever or pain develop. Advised patient would review with covering provider and return call with ant additional recommendations, patient is agreeable.  Dr. Edward JollySilva, any additional recommendations

## 2016-10-29 NOTE — Telephone Encounter (Signed)
I really recommend coming in for evaluation.  Most of the time the vaginitis screening has been negative for infection.  If she wishes to do an over the encounter treatment, she may want to try Monistat.

## 2016-10-30 NOTE — Telephone Encounter (Signed)
Spoke with patient, advised as seen below per Dr. Edward JollySilva. Patient states she will keep appointment scheduled for 11/05/16, declined earlier appointments. Patient verbalizes understanding and thankful for return call.  Routing to provider for final review. Patient is agreeable to disposition. Will close encounter.

## 2016-11-05 ENCOUNTER — Encounter: Payer: Self-pay | Admitting: Obstetrics and Gynecology

## 2016-11-05 ENCOUNTER — Ambulatory Visit (INDEPENDENT_AMBULATORY_CARE_PROVIDER_SITE_OTHER): Payer: BLUE CROSS/BLUE SHIELD | Admitting: Obstetrics and Gynecology

## 2016-11-05 VITALS — BP 100/62 | HR 70 | Ht 62.5 in | Wt 162.0 lb

## 2016-11-05 DIAGNOSIS — N760 Acute vaginitis: Secondary | ICD-10-CM

## 2016-11-05 DIAGNOSIS — N951 Menopausal and female climacteric states: Secondary | ICD-10-CM

## 2016-11-05 MED ORDER — ALUMINUM CHLORIDE 20 % EX SOLN: Freq: Every day | CUTANEOUS | 0 refills | Status: DC

## 2016-11-05 NOTE — Progress Notes (Signed)
GYNECOLOGY  VISIT   HPI: 46 y.o.   Married  Caucasian  female   G2P2002 with No LMP recorded. Patient has had an ablation.   here for vaginal discharge with some mild itching.   Had yeast vaginitis 07/30/16.  Felt Diflucan helped.  Feels like she is keeping heat and sweating excessively in her pelvic area.  Really wants to loose weight but feels too hot to even exercise.   She is very bothered by odor chronically.   Is off the Micronor since December 2017. Felt this was not helping her irregular menses. Had prior ablation. Spotted 2/8 - 2/11.  Spotted again 2/22.  Sonohysterogram done 08/09/16 and had EMB. Pelvic ultrasound with EMS 4.46 mm.  Ovaries were normal.  No filling defect.  Benign EMB - only benign endocervical tissue.  Was using Metrogel prn with menses.  GYNECOLOGIC HISTORY: No LMP recorded. Patient has had an ablation. Contraception:  Vasectomy Menopausal hormone therapy:  n/a Last mammogram: 08-01-16 3D Density C/benign scattered calcifications both breasts/Neg/BiRads2:[facility]  Last pap smear: 07-13-16 Neg:Neg HR HPV--endometrial cells present(neg EMB--hx ablation);06-29-15 Neg:Neg HR HPV        OB History    Gravida Para Term Preterm AB Living   2 2 2  0 0 2   SAB TAB Ectopic Multiple Live Births   0 0 0 0 2         Patient Active Problem List   Diagnosis Date Noted  . Chronic vaginitis 08/14/2013    Past Medical History:  Diagnosis Date  . Anxiety   . Carpal tunnel syndrome, bilateral   . Headache(784.0)    not any longer  . Hyperprolactinemia [facility](HCC)     Past Surgical History:  Procedure Laterality Date  . BLADDER SUSPENSION  05/24/2011   Procedure: TRANSVAGINAL TAPE (TVT) PROCEDURE;  Surgeon: Melony OverlyBrook A Silva;  Location: [facility];  Service: Gynecology;  Laterality: N/A;  with Cystoscopy  . CYSTOCELE REPAIR  05/24/2011   Procedure: ANTERIOR REPAIR (CYSTOCELE);  Surgeon: Melony OverlyBrook A Silva;  Location: [facility];  Service: Gynecology;  Laterality: N/A;  .  NASAL SINUS SURGERY     30 yrs ago  . NOVASURE ABLATION  05/24/2011   Procedure: NOVASURE ABLATION;  Surgeon: Melony OverlyBrook A Silva;  Location: [facility];  Service: Gynecology;  Laterality: N/A;    Current Outpatient Prescriptions  Medication Sig Dispense Refill  . ALPRAZolam (XANAX) 0.5 MG tablet as needed.  0  . atenolol (TENORMIN) 25 MG tablet Take 25 mg by mouth daily.    Marland Kitchen. Besifloxacin HCl (BESIVANCE) 0.6 % SUSP Apply 1 drop to eye 3 (three) times daily. Right eye only    . Calcium-Vitamin D (CALTRATE 600 PLUS-VIT D PO) Take 1 tablet by mouth daily.      . cyclobenzaprine (FLEXERIL) 10 MG tablet TAKE 1 TABLET BY ORAL ROUTE AT NIGHT.  2  . Difluprednate (DUREZOL) 0.05 % EMUL Apply 1 drop to eye 3 (three) times daily. Right eye only    . DULoxetine (CYMBALTA) 60 MG capsule Take 60 mg by mouth daily.  3  . fluticasone (FLONASE) 50 MCG/ACT nasal spray Place 2 sprays into the nose daily.    Marland Kitchen. loratadine (CLARITIN) 10 MG tablet Take 10 mg by mouth daily.    Marland Kitchen. LYRICA 75 MG capsule 2 (two) times daily.  1  . Multiple Vitamins-Minerals (MULTIVITAMIN WITH MINERALS) tablet Take 1 tablet by mouth daily.      . NONFORMULARY OR COMPOUNDED ITEM Place one suppository in the vagina  once a week at bedtime. Do this for the next 15 weeks. 15 each 0   Current Facility-Administered Medications  Medication Dose Route Frequency Provider Last Rate Last Dose  . triamcinolone acetonide (KENALOG) 10 MG/ML injection 10 mg  10 mg Other Once Asencion Islam, DPM         ALLERGIES: Erythromycin and Tolectin [tolmetin sodium]  Family History  Problem Relation Age of Onset  . Breast cancer Maternal Aunt     metastatic disease    Social History   Social History  . Marital status: Married    Spouse name: N/A  . Number of children: N/A  . Years of education: N/A   Occupational History  . Not on file.   Social History Main Topics  . Smoking status: Never Smoker  . Smokeless tobacco: Never Used  . Alcohol use 0.0  oz/week     Comment: rarely wine  . Drug use: No  . Sexual activity: Yes    Partners: Male    Birth control/ protection: Surgical     Comment: husband had vasectomy/Novasure ablation 2012/OCPs-[name]   Other Topics Concern  . Not on file   Social History Narrative  . No narrative on file    ROS:  Pertinent items are noted in HPI.  PHYSICAL EXAMINATION:    BP 100/62 (BP Location: Right Arm, Patient Position: Sitting, Cuff Size: Normal)   Pulse 70   Ht 5' 2.5" (1.588 m)   Wt 162 lb (73.5 kg)   BMI 29.16 kg/m     General appearance: alert, cooperative and appears stated age   Pelvic: External genitalia:  no lesions              Urethra:  normal appearing urethra with no masses, tenderness or lesions              Bartholins and Skenes: normal                 Vagina: normal appearing vagina with normal color and discharge, no lesions              Cervix: no lesions                Bimanual Exam:  Uterus:  normal size, contour, position, consistency, mobility, non-tender              Adnexa: no mass, fullness, tenderness            Ph of vagina - 4.5.   Chaperone was present for exam.  ASSESSMENT  Chronic vaginitis.  Excessive sweating.  Menopausal symptoms. Irregular cycles on and off [name].  Status post endometrial ablation.  HTN.   PLAN  Affirm.  Check FSH and estradiol. Drysol.  Do not use in vaginal mucosal area.  HRT option? Discussed potential hysterectomy for irregular bleeding as well.    An After Visit Summary was printed and given to the patient.  25______ minutes face to face time of which over 50% was spent in counseling.

## 2016-11-06 ENCOUNTER — Telehealth: Payer: Self-pay

## 2016-11-06 LAB — ESTRADIOL: ESTRADIOL: 205 pg/mL

## 2016-11-06 LAB — FOLLICLE STIMULATING HORMONE: FSH: 2.5 m[IU]/mL

## 2016-11-06 LAB — WET PREP BY MOLECULAR PROBE
CANDIDA SPECIES: NOT DETECTED
GARDNERELLA VAGINALIS: NOT DETECTED
TRICHOMONAS VAG: NOT DETECTED

## 2016-11-06 NOTE — Telephone Encounter (Signed)
-----   Message from Karina SallesBrook E Ramirez C Silva, MD sent at 11/06/2016  9:46 AM EST ----- CORRECTION TO LAST NOTE: She had a completely negative Affirm testing.  The positive yeast was from her Affirm 3 months ago.  She does not need Diflucan.   I do want her to come in to discuss the bleeding pattern further.  She has had irregular bleeding for a long time following her ablation.  The Shaune PascalCamilla has not worked to regulate her cycles.  I want to revisit this further and understand this better. We may want to consider other medical treatments or hysterectomy? We discussed this briefly in the office yesterday.  I am not sure if blood is contributing to her chronic vaginal odor.

## 2016-11-06 NOTE — Telephone Encounter (Signed)
Left message to call Kaitlyn at [phone]. 

## 2016-11-06 NOTE — Telephone Encounter (Signed)
Spoke with patient. Advised of results and message as seen below from Dr.Silva. Patient verbalizes understanding. Declines to schedule appointment wit Dr.Silva at this time. Patient would like to check her schedule and monitor symptoms. Will call back to schedule.  Routing to provider for final review. Patient agreeable to disposition. Will close encounter.

## 2016-11-06 NOTE — Telephone Encounter (Signed)
Patient returning your call.

## 2016-11-07 NOTE — Telephone Encounter (Signed)
Spoke with patient. Patient states Drysol solution not available at pharmacy, can you find out where I can get it? [facility]vised [facility] and [facility] call with [facility], patient is agreeable.  Spoke with Baxter HireKristen at [facility]- [address] -was advised no Drysol in stock, manufacture out at this time, may check with another [facility] to see if in stock.  [facility] with Mer[facility] [facility] [employer] [facility] [place] -was advised Drysol in stock. RN advised to pull RX from previous [facility] and fill. Will return call to patient to advise.  Call to patient, advised to pick up Drysol [facility] [address] [facility] [place] [place]. Patient verbalizes understanding and is agreeable.  Routing to provider for final review. Patient is agreeable to disposition. Will close encounter.

## 2017-01-07 ENCOUNTER — Other Ambulatory Visit: Payer: Self-pay | Admitting: Orthopaedic Surgery

## 2017-01-07 DIAGNOSIS — M546 Pain in thoracic spine: Secondary | ICD-10-CM

## 2017-01-07 DIAGNOSIS — M545 Low back pain: Secondary | ICD-10-CM

## 2017-02-13 ENCOUNTER — Other Ambulatory Visit: Payer: BLUE CROSS/BLUE SHIELD

## 2017-02-26 ENCOUNTER — Other Ambulatory Visit: Payer: BLUE CROSS/BLUE SHIELD

## 2017-03-08 ENCOUNTER — Ambulatory Visit (INDEPENDENT_AMBULATORY_CARE_PROVIDER_SITE_OTHER): Payer: BLUE CROSS/BLUE SHIELD | Admitting: Obstetrics and Gynecology

## 2017-03-08 ENCOUNTER — Telehealth: Payer: Self-pay | Admitting: Obstetrics and Gynecology

## 2017-03-08 ENCOUNTER — Encounter: Payer: Self-pay | Admitting: Obstetrics and Gynecology

## 2017-03-08 VITALS — BP 110/70 | HR 72 | Resp 16 | Wt 159.0 lb

## 2017-03-08 DIAGNOSIS — N926 Irregular menstruation, unspecified: Secondary | ICD-10-CM

## 2017-03-08 DIAGNOSIS — N761 Subacute and chronic vaginitis: Secondary | ICD-10-CM

## 2017-03-08 DIAGNOSIS — N898 Other specified noninflammatory disorders of vagina: Secondary | ICD-10-CM

## 2017-03-08 NOTE — Telephone Encounter (Signed)
Patient thinks she may have a bacterial infection and would like to see Dr Edward JollySilva.

## 2017-03-08 NOTE — Progress Notes (Signed)
GYNECOLOGY  VISIT   HPI: 46 y.o.   Married  Caucasian  female   G2P2002 with No LMP recorded. Patient has had an ablation.   here for vaginal odor . No itching or discharge. States she smells "a rotten potato." Asking about potential causes.  Does not feel she has increased sense of smell for other things. Last positive Affirm on 08/04/14.  Taking probiotics.  Used Metrogel 02/05/17. Tried douching with stainless steel apparatus, and this did not make a difference.   Feels like she is sweating a lot and keeping a "bacterial infection." Dry Sol did not help with her excessive sweating.  Knows she needs to loose weight.   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  She has had an ablation and has irregular spotting.  Last spotting was June 18.  Last pap on 07/13/16 negative and negative HR HPV.  Endometrial cells were present but her LMP on the pap requisition was 07/06/16.  We did a course of [name] due to her HTN and this did not seem to help with her bleeding.  She was also missing pills off and of at times. Eventually the [facility] was discontinued.   Sonohysterogram and EMB done on 08/09/16. EMS 4.46 mm. No myometrial masses noted.  Had limited filling of the endometrial cavity due to the ablation.  No mass was observed with partial filling.  Ovaries were normal.  No free fluid seen.  EMB did not have endometrial component.  Endocervical glandular tissue with microglandular hyperplasia.  FSH 2.5 on 11/05/16.  Prolactin 20.7 on 07/13/17.  GYNECOLOGIC HISTORY: No LMP recorded. Patient has had an ablation. Contraception:  Vasectomy Menopausal hormone therapy:  n/a Last mammogram:  08-01-16 3D Density C/benign scattered calcifications both breasts/Neg/BiRads2:[facility]  Last pap smear:   07-13-16 Neg:Neg HR HPV--endometrial cells present(neg EMB--hx ablation);06-29-15 Neg:Neg HR HPV        OB History    Gravida Para Term Preterm AB Living   2 2 2  0 0 2   SAB TAB Ectopic  Multiple Live Births   0 0 0 0 2         Patient Active Problem List   Diagnosis Date Noted  . Chronic vaginitis 08/14/2013    Past Medical History:  Diagnosis Date  . Anxiety   . Carpal tunnel syndrome, bilateral   . Headache(784.0)    not any longer  . Hyperprolactinemia [facility])     Past Surgical History:  Procedure Laterality Date  . BLADDER SUSPENSION  05/24/2011   Procedure: TRANSVAGINAL TAPE (TVT) PROCEDURE;  Surgeon: Melony Overly;  Location: [facility];  Service: Gynecology;  Laterality: N/A;  with Cystoscopy  . CYSTOCELE REPAIR  05/24/2011   Procedure: ANTERIOR REPAIR (CYSTOCELE);  Surgeon: Melony Overly;  Location: [facility];  Service: Gynecology;  Laterality: N/A;  . NASAL SINUS SURGERY     30 yrs ago  . NOVASURE ABLATION  05/24/2011   Procedure: NOVASURE ABLATION;  Surgeon: Melony Overly;  Location: [facility];  Service: Gynecology;  Laterality: N/A;    Current Outpatient Prescriptions  Medication Sig Dispense Refill  . ALPRAZolam (XANAX) 0.5 MG tablet as needed.  0  . atenolol (TENORMIN) 25 MG tablet Take 25 mg by mouth daily.    . Calcium-Vitamin D (CALTRATE 600 PLUS-VIT D PO) Take 1 tablet by mouth daily.      . cyclobenzaprine (FLEXERIL) 10 MG tablet TAKE 1 TABLET BY ORAL ROUTE AT NIGHT.  2  . DULoxetine (CYMBALTA) 60  MG capsule Take 60 mg by mouth daily.  3  . fluticasone (FLONASE) 50 MCG/ACT nasal spray Place 2 sprays into the nose daily.    Marland Kitchen loratadine (CLARITIN) 10 MG tablet Take 10 mg by mouth daily.    Marland Kitchen LYRICA 75 MG capsule 2 (two) times daily.  1  . Multiple Vitamins-Minerals (MULTIVITAMIN WITH MINERALS) tablet Take 1 tablet by mouth daily.      Marland Kitchen aluminum chloride (DRYSOL) 20 % external solution Apply topically at bedtime. (Patient not taking: Reported on 03/08/2017) 35 mL 0   Current Facility-Administered Medications  Medication Dose Route Frequency Provider Last Rate Last Dose  . triamcinolone acetonide (KENALOG) 10 MG/ML injection 10 mg  10 mg Other Once  Asencion Islam, DPM         ALLERGIES: Erythromycin and Tolectin [tolmetin sodium]  Family History  Problem Relation Age of Onset  . Breast cancer Maternal Aunt        metastatic disease    Social History   Social History  . Marital status: Married    Spouse name: N/A  . Number of children: N/A  . Years of education: N/A   Occupational History  . Not on file.   Social History Main Topics  . Smoking status: Never Smoker  . Smokeless tobacco: Never Used  . Alcohol use 0.0 oz/week     Comment: rarely wine  . Drug use: No  . Sexual activity: Yes    Partners: Male    Birth control/ protection: Surgical     Comment: husband had vasectomy/Novasure ablation 2012/OCPs-[name]   Other Topics Concern  . Not on file   Social History Narrative  . No narrative on file    ROS:  Pertinent items are noted in HPI.  PHYSICAL EXAMINATION:    BP 110/70 (BP Location: Right Arm, Patient Position: Sitting, Cuff Size: Normal)   Pulse 72   Resp 16   Wt 159 lb (72.1 kg)   BMI 28.62 kg/m     General appearance: alert, cooperative and appears stated age   Pelvic: External genitalia:  no lesions              Urethra:  normal appearing urethra with no masses, tenderness or lesions              Bartholins and Skenes: normal                 Vagina: normal appearing vagina with normal color and discharge, no lesions              Cervix: no lesions                Bimanual Exam:  Uterus:  normal size, contour, position, consistency, mobility, non-tender              Adnexa: no mass, fullness, tenderness              PH of vagina 4.25.   Chaperone was present for exam.  ASSESSMENT  Vaginal odor.  Chronic.  Multiple Affirm tests negative for bacterial vaginosis. Excess heat.  Weight gain.  Irregular cycles.  Status post endometrial ablation.   Post ablation EMB - no endometrial cells.   PLAN  Extensive discussion today about vaginal odor including a literature search we did  together in Up To Date.  Vaginitis testing performed.  I have not given the patient a prescription today.  I do raise the question of her intermittent bleeding is contributing to the  odor.  We did talk about hysterectomy today.  I was clear that this would not be for treatment of vaginal odor but for her irregular vaginal bleeding for which I have limited ability to evaluate due to the ablation.  She is not a candidate for combined OCPs due to her HTN.  I did recommend weight loss.    An After Visit Summary was printed and given to the patient.  __40____ minutes face to face time of which over 50% was spent in counseling.

## 2017-03-08 NOTE — Telephone Encounter (Signed)
Call to patient. Patient states frustration that she feels like she constantly has a yeast or BV infection. Patient states she has had "an odor I can smell through my clothes" for several weeks now, but has put off calling. Patient declines discharge. States she has talked with Dr. Edward JollySilva before about this issue and would like to be seen today, as late in the afternoon as possible as [place] in [place]. RN advised she would speak with Dr. Edward JollySilva about appointment time and return call to patient. Patient agreeable.   Routing to provider for review.

## 2017-03-08 NOTE — Telephone Encounter (Signed)
Call to patient. RN offered office visit for Monday at 0830. Patient questioning why she cannot be seen today. RN advised she would need to speak with clinical supervisor in regards to appointment time. Patient states she does not mind waiting, but would really like to be seen by Dr. Edward JollySilva today. Patient scheduled for 03/08/17 at 1600 per Billie RuddySally Yeakley, RN.   Routing to provider for final review. Patient agreeable to disposition. Will close encounter.

## 2017-03-13 LAB — NUSWAB VAGINITIS (VG)
Candida albicans, NAA: NEGATIVE
Candida glabrata, NAA: NEGATIVE
TRICH VAG BY NAA: NEGATIVE

## 2017-03-15 ENCOUNTER — Telehealth: Payer: Self-pay | Admitting: Obstetrics and Gynecology

## 2017-03-15 NOTE — Telephone Encounter (Signed)
Spoke with patient. Advised of results as seen below from Dr.Silva. Patient would like to return call to schedule an appointment as she does not have her calendar available.   Will close encounter.

## 2017-03-15 NOTE — Telephone Encounter (Signed)
Patient calling for results.

## 2017-03-15 NOTE — Telephone Encounter (Signed)
Left message to call Kaitlyn at [phone].  Notes recorded by Patton SallesAmundson C Silva, Brook E, MD on 03/13/2017 at 5:49 PM EDT Please contact patient back about her negative vaginitis screening.  I would like to have her return to discussed her irregular vaginal bleeding further.  We are not certain if this is playing a role in the vaginal odor she continues to have.

## 2017-04-01 ENCOUNTER — Ambulatory Visit
Admission: RE | Admit: 2017-04-01 | Discharge: 2017-04-01 | Disposition: A | Discharge status: Home / Self Care | Payer: BLUE CROSS/BLUE SHIELD | Source: Ambulatory Visit | Attending: Orthopaedic Surgery | Admitting: Orthopaedic Surgery

## 2017-04-01 DIAGNOSIS — M546 Pain in thoracic spine: Secondary | ICD-10-CM

## 2017-04-01 DIAGNOSIS — M545 Low back pain: Secondary | ICD-10-CM

## 2017-05-21 ENCOUNTER — Other Ambulatory Visit: Payer: Self-pay | Admitting: Orthopaedic Surgery

## 2017-05-21 ENCOUNTER — Telehealth: Payer: Self-pay | Admitting: Obstetrics and Gynecology

## 2017-05-21 DIAGNOSIS — M5093 Cervical disc disorder, unspecified, cervicothoracic region: Secondary | ICD-10-CM

## 2017-05-21 NOTE — Telephone Encounter (Signed)
Returned call to patient, unable to leave message, mailbox full.

## 2017-05-21 NOTE — Telephone Encounter (Signed)
Spoke with patient, advised as seen below per Dr. Edward Jolly. Patient verbalizes understanding.  Patient is agreeable to disposition. Will close encounter.

## 2017-05-21 NOTE — Telephone Encounter (Signed)
Spoke with patient. Reports vaginal itching and white clumpy discharge for the past 4 days. Denies odor, pain/discomfort, bleeding or urinary complaints. Requesting diflucan RX.  Advised patient OV recommended for further evaluation, patient declined, stating she has called in diflucan in the past. Recommended OTC monistat, if symptoms do not resolve, return call to schedule OV.   Advised patient Dr. Edward Jolly will review, will return call with any additional recommendations. Patient verbalizes understanding.  Last OV 03/08/17  Dr. Edward Jolly -any additional recommendations?

## 2017-05-21 NOTE — Telephone Encounter (Signed)
I agree with your recommendation for OTC Monistat and office visit if symptoms do not resolve.

## 2017-05-21 NOTE — Telephone Encounter (Signed)
Patient called and said she is having "symptoms of a yeast infection" today and she'd like some medication called in. Patient declined an appointment until speaking with the nurse.

## 2017-06-04 ENCOUNTER — Other Ambulatory Visit: Payer: BLUE CROSS/BLUE SHIELD

## 2017-07-11 ENCOUNTER — Inpatient Hospital Stay: Admission: RE | Admit: 2017-07-11 | Payer: Self-pay | Source: Ambulatory Visit

## 2017-07-19 ENCOUNTER — Ambulatory Visit: Payer: BLUE CROSS/BLUE SHIELD | Admitting: Obstetrics and Gynecology

## 2017-07-19 ENCOUNTER — Encounter: Payer: Self-pay | Admitting: Obstetrics and Gynecology

## 2017-07-19 NOTE — Progress Notes (Deleted)
46 y.o. 912P2002 Married Caucasian female here for annual exam.    PCP:     No LMP recorded. Patient has had an ablation.           Sexually active: {yes no:314532}  The current method of family planning is vasectomy.    Exercising: {yes no:314532}  {types:19826} Smoker:  no  Health Maintenance: Pap: 07-13-16 Neg:Neg HR HPV, 06-29-15 Neg:Neg HR HPV History of abnormal Pap:  Yes,07/2013 ASCUS:Pos HR HPV with colpo showing low grade dysplasia. Hx cryotherapy to cervix years ago. MMG: 08-01-16 3D Density C/benign scattered calcifications in both breasts/Neg/BiRads2:[facility] Colonoscopy:  n/a BMD:   n/a  Result  n/a TDaP:  07/2012 Gardasil:   no HIV: 06-29-15 NR Hep C: 06-29-15 Neg Screening Labs:  Hb today: ***, Urine today: ***   reports that  has never smoked. she has never used smokeless tobacco. She reports that she drinks alcohol. She reports that she does not use drugs.  Past Medical History:  Diagnosis Date  . Anxiety   . Carpal tunnel syndrome, bilateral   . Headache(784.0)    not any longer  . Hyperprolactinemia [facility](HCC)     Past Surgical History:  Procedure Laterality Date  . BLADDER SUSPENSION  05/24/2011   Procedure: TRANSVAGINAL TAPE (TVT) PROCEDURE;  Surgeon: Melony OverlyBrook A Silva;  Location: [facility];  Service: Gynecology;  Laterality: N/A;  with Cystoscopy  . CYSTOCELE REPAIR  05/24/2011   Procedure: ANTERIOR REPAIR (CYSTOCELE);  Surgeon: Melony OverlyBrook A Silva;  Location: [facility];  Service: Gynecology;  Laterality: N/A;  . NASAL SINUS SURGERY     30 yrs ago  . NOVASURE ABLATION  05/24/2011   Procedure: NOVASURE ABLATION;  Surgeon: Melony OverlyBrook A Silva;  Location: [facility];  Service: Gynecology;  Laterality: N/A;    Current Outpatient Medications  Medication Sig Dispense Refill  . ALPRAZolam (XANAX) 0.5 MG tablet as needed.  0  . aluminum chloride (DRYSOL) 20 % external solution Apply topically at bedtime. (Patient not taking: Reported on 03/08/2017) 35 mL 0  . atenolol (TENORMIN) 25 MG tablet  Take 25 mg by mouth daily.    . Calcium-Vitamin D (CALTRATE 600 PLUS-VIT D PO) Take 1 tablet by mouth daily.      . cyclobenzaprine (FLEXERIL) 10 MG tablet TAKE 1 TABLET BY ORAL ROUTE AT NIGHT.  2  . DULoxetine (CYMBALTA) 60 MG capsule Take 60 mg by mouth daily.  3  . fluticasone (FLONASE) 50 MCG/ACT nasal spray Place 2 sprays into the nose daily.    Marland Kitchen. loratadine (CLARITIN) 10 MG tablet Take 10 mg by mouth daily.    Marland Kitchen. LYRICA 75 MG capsule 2 (two) times daily.  1  . Multiple Vitamins-Minerals (MULTIVITAMIN WITH MINERALS) tablet Take 1 tablet by mouth daily.       Current Facility-Administered Medications  Medication Dose Route Frequency Provider Last Rate Last Dose  . triamcinolone acetonide (KENALOG) 10 MG/ML injection 10 mg  10 mg Other Once Asencion IslamStover, Titorya, DPM        Family History  Problem Relation Age of Onset  . Breast cancer Maternal Aunt        metastatic disease    ROS:  Pertinent items are noted in HPI.  Otherwise, a comprehensive ROS was negative.  Exam:   There were no vitals taken for this visit.    General appearance: alert, cooperative and appears stated age Head: Normocephalic, without obvious abnormality, atraumatic Neck: no adenopathy, supple, symmetrical, trachea midline and thyroid normal to inspection and palpation  Lungs: clear to auscultation bilaterally Breasts: normal appearance, no masses or tenderness, No nipple retraction or dimpling, No nipple discharge or bleeding, No axillary or supraclavicular adenopathy Heart: regular rate and rhythm Abdomen: soft, non-tender; no masses, no organomegaly Extremities: extremities normal, atraumatic, no cyanosis or edema Skin: Skin color, texture, turgor normal. No rashes or lesions Lymph nodes: Cervical, supraclavicular, and axillary nodes normal. No abnormal inguinal nodes palpated Neurologic: Grossly normal  Pelvic: External genitalia:  no lesions              Urethra:  normal appearing urethra with no masses,  tenderness or lesions              Bartholins and Skenes: normal                 Vagina: normal appearing vagina with normal color and discharge, no lesions              Cervix: no lesions              Pap taken: {yes no:314532} Bimanual Exam:  Uterus:  normal size, contour, position, consistency, mobility, non-tender              Adnexa: no mass, fullness, tenderness              Rectal exam: {yes no:314532}.  Confirms.              Anus:  normal sphincter tone, no lesions  Chaperone was present for exam.  Assessment:   Well woman visit with normal exam.   Plan: Mammogram screening discussed. Recommended self breast awareness. Pap and HR HPV as above. Guidelines for Calcium, Vitamin D, regular exercise program including cardiovascular and weight bearing exercise.   Follow up annually and prn.   Additional counseling given.  {yes [id number]:314532}. _______ minutes face to face time of which over 50% was spent in counseling.    After visit summary provided.

## 2017-08-15 ENCOUNTER — Telehealth: Payer: Self-pay | Admitting: Obstetrics and Gynecology

## 2017-08-15 NOTE — Telephone Encounter (Signed)
Left message for patient to reschedule 09/05/17 appt with Dr Edward JollySilva

## 2017-08-15 NOTE — Telephone Encounter (Signed)
Pt called back aware of appt cancellation.

## 2017-09-05 ENCOUNTER — Ambulatory Visit: Payer: Self-pay | Admitting: Obstetrics and Gynecology

## 2017-10-10 ENCOUNTER — Ambulatory Visit (INDEPENDENT_AMBULATORY_CARE_PROVIDER_SITE_OTHER): Payer: BLUE CROSS/BLUE SHIELD | Admitting: Obstetrics and Gynecology

## 2017-10-10 ENCOUNTER — Encounter: Payer: Self-pay | Admitting: Obstetrics and Gynecology

## 2017-10-10 ENCOUNTER — Other Ambulatory Visit (HOSPITAL_COMMUNITY)
Admission: RE | Admit: 2017-10-10 | Discharge: 2017-10-10 | Disposition: A | Discharge status: Home / Self Care | Payer: BLUE CROSS/BLUE SHIELD | Source: Ambulatory Visit | Attending: Obstetrics and Gynecology | Admitting: Obstetrics and Gynecology

## 2017-10-10 ENCOUNTER — Ambulatory Visit: Payer: BLUE CROSS/BLUE SHIELD | Admitting: Obstetrics and Gynecology

## 2017-10-10 VITALS — BP 112/60 | HR 80 | Resp 16 | Ht 62.5 in | Wt 159.0 lb

## 2017-10-10 DIAGNOSIS — E221 Hyperprolactinemia: Secondary | ICD-10-CM | POA: Diagnosis not present

## 2017-10-10 DIAGNOSIS — Z01419 Encounter for gynecological examination (general) (routine) without abnormal findings: Secondary | ICD-10-CM | POA: Insufficient documentation

## 2017-10-10 NOTE — Progress Notes (Signed)
47 y.o. G22P2002 Married Caucasian female here for annual exam.    Having spotting with pain for the first day.  LMP 10/06/16.  Menses every 24 - 26 days.  Status post ablation.  Last EMB was 08/14/16 and no endometrial cells obtained.   Reports aches and pains for years.  Trying CBD oil with THC which is helping.  Off Lyrica.  Still on Cymbalta.   Wants a pap today.   PCP: Tyson Dense, MD    No LMP recorded. Patient has had an ablation.           Sexually active: Yes.    The current method of family planning is vasectomy.    Exercising: No.   Smoker:  no  Health Maintenance: Pap: 07-13-16 Neg:neg HR HPV, 06-29-15 Neg:Neg HR HPV History of abnormal Pap:  Yes.  LGSIL in 07/2013. MMG:  09/2017 normal per patient with [facility].   Colonoscopy:  2018 polyp;next due 5 years BMD:   n/a  Result  n/a TDaP:  2018 Gardasil:   no HIV:06-29-15 NR Hep C:06-29-15 Neg Screening Labs:  PCP.    reports that  has never smoked. she has never used smokeless tobacco. She reports that she drinks alcohol. She reports that she does not use drugs.  Past Medical History:  Diagnosis Date  . Anxiety   . Carpal tunnel syndrome, bilateral   . Headache(784.0)    not any longer  . Hyperprolactinemia [facility])     Past Surgical History:  Procedure Laterality Date  . BLADDER SUSPENSION  05/24/2011   Procedure: TRANSVAGINAL TAPE (TVT) PROCEDURE;  Surgeon: Melony Overly;  Location: [facility];  Service: Gynecology;  Laterality: N/A;  with Cystoscopy  . CYSTOCELE REPAIR  05/24/2011   Procedure: ANTERIOR REPAIR (CYSTOCELE);  Surgeon: Melony Overly;  Location: [facility];  Service: Gynecology;  Laterality: N/A;  . NASAL SINUS SURGERY     30 yrs ago  . NOVASURE ABLATION  05/24/2011   Procedure: NOVASURE ABLATION;  Surgeon: Melony Overly;  Location: [facility];  Service: Gynecology;  Laterality: N/A;    Current Outpatient Medications  Medication Sig Dispense Refill  . ALPRAZolam (XANAX) 0.5 MG tablet as needed.  0  .  atenolol (TENORMIN) 25 MG tablet Take 25 mg by mouth daily.    . Calcium-Vitamin D (CALTRATE 600 PLUS-VIT D PO) Take 1 tablet by mouth daily.      . cyclobenzaprine (FLEXERIL) 10 MG tablet TAKE 1 TABLET BY ORAL ROUTE AT NIGHT.  2  . DULoxetine (CYMBALTA) 60 MG capsule Take 60 mg by mouth daily.  3  . fluticasone (FLONASE) 50 MCG/ACT nasal spray Place 2 sprays into the nose daily.    . Multiple Vitamins-Minerals (MULTIVITAMIN WITH MINERALS) tablet Take 1 tablet by mouth daily.       Current Facility-Administered Medications  Medication Dose Route Frequency Provider Last Rate Last Dose  . triamcinolone acetonide (KENALOG) 10 MG/ML injection 10 mg  10 mg Other Once Asencion Islam, DPM        Family History  Problem Relation Age of Onset  . Breast cancer Maternal Aunt        metastatic disease    ROS:  Pertinent items are noted in HPI.  Otherwise, a comprehensive ROS was negative.  Exam:   BP 112/60 (BP Location: Right Arm, Patient Position: Sitting, Cuff Size: Normal)   Pulse 80   Resp 16   Ht 5' 2.5" (1.588 m)   Wt 159 lb (72.1 kg)  BMI 28.62 kg/m     General appearance: alert, cooperative and appears stated age Head: Normocephalic, without obvious abnormality, atraumatic Neck: no adenopathy, supple, symmetrical, trachea midline and thyroid normal to inspection and palpation Lungs: clear to auscultation bilaterally Breasts: normal appearance, no masses or tenderness, No nipple retraction or dimpling, No nipple discharge or bleeding, No axillary or supraclavicular adenopathy Heart: regular rate and rhythm Abdomen: soft, non-tender; no masses, no organomegaly Extremities: extremities normal, atraumatic, no cyanosis or edema Skin: Skin color, texture, turgor normal. No rashes or lesions Lymph nodes: Cervical, supraclavicular, and axillary nodes normal. No abnormal inguinal nodes palpated Neurologic: Grossly normal  Pelvic: External genitalia:  no lesions              Urethra:   normal appearing urethra with no masses, tenderness or lesions              Bartholins and Skenes: normal                 Vagina: normal appearing vagina with normal color and discharge, no lesions              Cervix: no lesions              Pap taken: Yes.   Bimanual Exam:  Uterus:  normal size, contour, position, consistency, mobility, non-tender              Adnexa: no mass, fullness, tenderness              Rectal exam: Yes.  .  Confirms.              Anus:  normal sphincter tone, no lesions  Chaperone was present for exam.  Assessment:   Well woman visit with normal exam. Status post ablation.  Status post TVT.  Hx hyperprolactinemia.  Plan: Mammogram screening discussed.  3D discussed. Recommended self breast awareness. Pap and HR HPV as above. Guidelines for Calcium, Vitamin D, regular exercise program including cardiovascular and weight bearing exercise. Prolactin level. Routine labs with PCP. Monitor cycles for irregularity.  Follow up annually and prn.    After visit summary provided.

## 2017-10-10 NOTE — Patient Instructions (Signed)

## 2017-10-11 ENCOUNTER — Other Ambulatory Visit: Payer: Self-pay | Admitting: Obstetrics and Gynecology

## 2017-10-11 DIAGNOSIS — E229 Hyperfunction of pituitary gland, unspecified: Principal | ICD-10-CM

## 2017-10-11 DIAGNOSIS — R7989 Other specified abnormal findings of blood chemistry: Secondary | ICD-10-CM

## 2017-10-11 LAB — CYTOLOGY - PAP
Diagnosis: NEGATIVE
HPV: NOT DETECTED

## 2017-10-11 LAB — PROLACTIN: Prolactin: 32.5 ng/mL — ABNORMAL HIGH (ref 4.8–23.3)

## 2017-10-17 ENCOUNTER — Telehealth: Payer: Self-pay | Admitting: Obstetrics and Gynecology

## 2017-10-17 NOTE — Telephone Encounter (Signed)
Spoke with patient.   1. Request pap results, reviewed as seen below.  Notes recorded by Karina Ramirez, Brook E, MD on 10/14/2017 at 4:18 PM EST Pap and HR HPV negative.  Recall - 02.  2. Reports spotting with intercourse on 2/8 & 2/9. Denies pain, "feels lubricated". Does not use a lubricant. States this has happened in the past, did not feel it was significant to discuss at [facility], then it happened again. Hx of ablation , reports spotting for a couple of days with menses, LMP 2/3.   Patient is scheduled for repeat prolactin level on 2/19, advised will review with Dr. Edward Ramirez for any additional recommendations prior to scheduling OV. Patient is agreeable.   Dr. Edward Ramirez -please review and advise?

## 2017-10-17 NOTE — Telephone Encounter (Signed)
Please have the patient see me for an office visit on the day of her blood work.  I would like to do a pap and check for cervical/vaginal infection.

## 2017-10-17 NOTE — Telephone Encounter (Signed)
Spoke with patient, advised as seen below per Dr. Edward JollySilva. Lab appt cancelled, rescheduled to OV on 2/21 at 8:45am with Dr. Edward JollySilva. Patient aware will have prolactin lab drawn at OV. Reviewed with patient no vigorous exercise or intercourse 24 hrs prior to appt, fasting for labs, no nipple stimulation prior to appt. Patient verbalizes understanding and is agreeable.   Routing to provider for final review. Patient is agreeable to disposition. Will close encounter.

## 2017-10-17 NOTE — Telephone Encounter (Signed)
Patient called requesting to speak to the nurse with questions she has. She'd also like to know her recent pap results, if they're ready..Marland Kitchen

## 2017-10-22 ENCOUNTER — Other Ambulatory Visit: Payer: Self-pay

## 2017-10-24 ENCOUNTER — Encounter: Payer: Self-pay | Admitting: Obstetrics and Gynecology

## 2017-10-24 ENCOUNTER — Ambulatory Visit: Payer: BLUE CROSS/BLUE SHIELD | Admitting: Obstetrics and Gynecology

## 2017-10-24 ENCOUNTER — Other Ambulatory Visit (HOSPITAL_COMMUNITY)
Admission: RE | Admit: 2017-10-24 | Discharge: 2017-10-24 | Disposition: A | Discharge status: Home / Self Care | Payer: BLUE CROSS/BLUE SHIELD | Source: Ambulatory Visit | Attending: Obstetrics and Gynecology | Admitting: Obstetrics and Gynecology

## 2017-10-24 ENCOUNTER — Other Ambulatory Visit: Payer: Self-pay

## 2017-10-24 VITALS — BP 110/70 | HR 84 | Resp 16 | Wt 158.0 lb

## 2017-10-24 DIAGNOSIS — E229 Hyperfunction of pituitary gland, unspecified: Secondary | ICD-10-CM

## 2017-10-24 DIAGNOSIS — R7989 Other specified abnormal findings of blood chemistry: Secondary | ICD-10-CM

## 2017-10-24 DIAGNOSIS — N930 Postcoital and contact bleeding: Secondary | ICD-10-CM | POA: Insufficient documentation

## 2017-10-24 DIAGNOSIS — B373 Candidiasis of vulva and vagina: Secondary | ICD-10-CM | POA: Insufficient documentation

## 2017-10-24 NOTE — Progress Notes (Signed)
labGYNECOLOGY  VISIT   HPI: 47 y.o.   Married  Caucasian  female   G2P2002 with Patient's last menstrual period was 10/06/2017.   here for postcoital  Bleeding.  Noticed a fair amount of bleeding with intercourse.  This is the first time this has happened in a long time.  No use of any inserts at the time of intercourse.  No pain with intercourse.   Here for a recheck of prolactin level as well. She has known hyperprolactinemia, but levels have been normal for many years.  Had MRI in the past.  No breast discharge.  GYNECOLOGIC HISTORY: Patient's last menstrual period was 10/06/2017. Contraception:  Vasectomy Menopausal hormone therapy:  none Last mammogram:  January 2019 -- done at [facility] Last pap smear:  10/10/17 Pap and HR HPV negative; 07-13-16 Neg:neg HR HPV, 06-29-15 Neg:Neg HR HPV        OB History    Gravida Para Term Preterm AB Living   2 2 2  0 0 2   SAB TAB Ectopic Multiple Live Births   0 0 0 0 2         Patient Active Problem List   Diagnosis Date Noted  . Chronic vaginitis 08/14/2013    Past Medical History:  Diagnosis Date  . Anxiety   . Carpal tunnel syndrome, bilateral   . Headache(784.0)    not any longer  . Hyperprolactinemia [facility])     Past Surgical History:  Procedure Laterality Date  . BLADDER SUSPENSION  05/24/2011   Procedure: TRANSVAGINAL TAPE (TVT) PROCEDURE;  Surgeon: Melony Overly;  Location: [facility];  Service: Gynecology;  Laterality: N/A;  with Cystoscopy  . CYSTOCELE REPAIR  05/24/2011   Procedure: ANTERIOR REPAIR (CYSTOCELE);  Surgeon: Melony Overly;  Location: [facility];  Service: Gynecology;  Laterality: N/A;  . NASAL SINUS SURGERY     30 yrs ago  . NOVASURE ABLATION  05/24/2011   Procedure: NOVASURE ABLATION;  Surgeon: Melony Overly;  Location: [facility];  Service: Gynecology;  Laterality: N/A;    Current Outpatient Medications  Medication Sig Dispense Refill  . ALPRAZolam (XANAX) 0.5 MG tablet as needed.  0  . atenolol (TENORMIN) 25 MG  tablet Take 25 mg by mouth daily.    . Calcium-Vitamin D (CALTRATE 600 PLUS-VIT D PO) Take 1 tablet by mouth daily.      . cyclobenzaprine (FLEXERIL) 10 MG tablet TAKE 1 TABLET BY ORAL ROUTE AT NIGHT.  2  . DULoxetine (CYMBALTA) 60 MG capsule Take 60 mg by mouth daily.  3  . fluticasone (FLONASE) 50 MCG/ACT nasal spray Place 2 sprays into the nose daily.    . Multiple Vitamins-Minerals (MULTIVITAMIN WITH MINERALS) tablet Take 1 tablet by mouth daily.       No current facility-administered medications for this visit.      ALLERGIES: Erythromycin and Tolectin [tolmetin sodium]  Family History  Problem Relation Age of Onset  . Breast cancer Maternal Aunt        metastatic disease    Social History   Socioeconomic History  . Marital status: Married    Spouse name: Not on file  . Number of children: Not on file  . Years of education: Not on file  . Highest education level: Not on file  Social Needs  . Financial resource strain: Not on file  . Food insecurity - worry: Not on file  . Food insecurity - inability: Not on file  . Transportation needs - medical: Not  on file  . Transportation needs - non-medical: Not on file  Occupational History  . Not on file  Tobacco Use  . Smoking status: Never Smoker  . Smokeless tobacco: Never Used  Substance and Sexual Activity  . Alcohol use: Yes    Alcohol/week: 0.0 oz    Comment: rarely wine  . Drug use: No  . Sexual activity: Yes    Partners: Male    Birth control/protection: Surgical    Comment: husband had vasectomy/Novasure ablation 2012/[name]  Other Topics Concern  . Not on file  Social History Narrative  . Not on file    ROS:  Pertinent items are noted in HPI.  PHYSICAL EXAMINATION:    BP 110/70 (BP Location: Right Arm, Patient Position: Sitting, Cuff Size: Large)   Pulse 84   Resp 16   Wt 158 lb (71.7 kg)   LMP 10/06/2017   BMI 28.44 kg/m     General appearance: alert, cooperative and appears stated  age  Pelvic: External genitalia:  no lesions              Urethra:  normal appearing urethra with no masses, tenderness or lesions              Bartholins and Skenes: normal                 Vagina: normal appearing vagina with normal color and discharge, no lesions              Cervix: no lesions                Bimanual Exam:  Uterus:  normal size, contour, position, consistency, mobility, non-tender              Adnexa: no mass, fullness, tenderness                Chaperone was present for exam.  ASSESSMENT  Post coital bleeding.  Normal pap . Elevated prolactin level. Known hyperprolactinemia.  PLAN  Vaginitis/cervicitis testing.  Prolactin, TSH, quant hCG.  Will do colpo if results are normal and continues to have post coital bleeding.   An After Visit Summary was printed and given to the patient.  ____15__ minutes face to face time of which over 50% was spent in counseling.

## 2017-10-25 ENCOUNTER — Telehealth: Payer: Self-pay | Admitting: Obstetrics and Gynecology

## 2017-10-25 DIAGNOSIS — N930 Postcoital and contact bleeding: Secondary | ICD-10-CM

## 2017-10-25 LAB — PROLACTIN: Prolactin: 31.1 ng/mL — ABNORMAL HIGH (ref 4.8–23.3)

## 2017-10-25 LAB — CERVICOVAGINAL ANCILLARY ONLY
Bacterial vaginitis: NEGATIVE
CANDIDA VAGINITIS: POSITIVE — AB
CHLAMYDIA, DNA PROBE: NEGATIVE
NEISSERIA GONORRHEA: NEGATIVE
TRICH (WINDOWPATH): NEGATIVE

## 2017-10-25 NOTE — Telephone Encounter (Signed)
[name] from [facility] requesting to speak with a nurse regarding patient Karina MulletJanet Ramirez

## 2017-10-25 NOTE — Telephone Encounter (Signed)
Spoke with [name] at [facility]. Calling to cofirm labs requested from PAP collected on 10/24/17, Dr. Edward JollySilva present during call.  Confirmed with Dr. Edward JollySilva -Gonorrhea, chlamydia, trichomonas, BV and candida -No PAP  Was advised will need to be ordered as ancillary order, LAB [id number]. New order placed.   Routing to Dr. Edward JollySilva, will close encounter.

## 2017-10-29 LAB — BETA HCG QUANT (REF LAB)

## 2017-10-29 LAB — TSH: TSH: 3.21 u[IU]/mL (ref 0.450–4.500)

## 2017-10-29 LAB — SPECIMEN STATUS REPORT

## 2017-10-31 ENCOUNTER — Other Ambulatory Visit: Payer: Self-pay | Admitting: Obstetrics and Gynecology

## 2017-10-31 ENCOUNTER — Telehealth: Payer: Self-pay

## 2017-10-31 DIAGNOSIS — N930 Postcoital and contact bleeding: Secondary | ICD-10-CM

## 2017-10-31 DIAGNOSIS — E221 Hyperprolactinemia: Secondary | ICD-10-CM

## 2017-10-31 MED ORDER — FLUCONAZOLE 150 MG PO TABS: 150.0000 mg | ORAL_TABLET | Freq: Once | ORAL | 0 refills | Status: AC

## 2017-10-31 NOTE — Telephone Encounter (Signed)
-----   Message from Patton SallesBrook E [name], MD sent at 10/29/2017  8:07 PM EST ----- Please contact patient with results.   Her prolactin level is still slightly elevated. This can occur if she had eaten just before doing the blood work.  If she had just eaten, I am recommending we repeat this in one month. Please place a future order for one month from now and make a lab appointment. If she had not eaten, I am recommending she have an MRI of the brain as the level of prolactin went up from her usual baseline.  I did add a pregnancy test and a TSH due to the elevated prolactin. Her pregnancy test was negative, and her thyroid test was normal.   In a separate result note, the patient had cervicitis/vaginitis testing which was negative for gonorrhea, chlamydia, trichomonas, and bacterial vaginosis.  She tested positive for yeast.  If she is having any vaginal itching, discharge or irritation, I recommend Diflucan 150 mg po x 1.  May repeat in 72 hours.  Dispense 2, RF none.  I am recommending we proceed with a colposcopy to evaluate her cervix further for post coital bleeding of unclear etiology.   Please send for precert.  She is expecting this may be needed.

## 2017-10-31 NOTE — Telephone Encounter (Signed)
Spoke with patient. Advised of message as seen below from Dr.Silva. Patient is agreeable. Appointment for labs and colposcopy scheduled for 12/04/2017 at 10 am with Dr.Silva. Aware to be fasting for lab work, but to bring a snack to eat after before her procedure appointment with Dr.Silva. Patient is agreeable and verbalizes understanding. Note placed on appointment for Dr.Silva to be notified if patient cancels. Order placed.  Routing to provider for final review. Patient agreeable to disposition. Will close encounter.

## 2017-10-31 NOTE — Telephone Encounter (Signed)
Spoke with patient. All results and message given as seen below from Dr.Silva. Patient states that she had not eaten before her lab testing. States she has had an elevated prolactin level before and had a brain MRI that was normal. Does not wish to proceed with this at this time. Wants to wait and recheck in one month. Would like to review this with Dr.Silva to see if this is okay. If this is okay would like to have colposcopy done at the same time. Declines to schedule at this time until reviewed with Dr.Silva. Rx for Diflucan 150 mg po x 1 repeat in 72 hours #2 0RF sent to pharmacy on file.

## 2017-10-31 NOTE — Telephone Encounter (Signed)
I am ok with this plan to do both in one mont. Please put a note in her chart that I need to be informed if she cancels or reschedules her appointment so that we keep this plan in mind.

## 2017-11-06 ENCOUNTER — Telehealth: Payer: Self-pay | Admitting: Obstetrics and Gynecology

## 2017-11-06 NOTE — Telephone Encounter (Signed)
Spoke with patient. Patient states that she has had an ablation and would like to know if she has bleeding before her appointment if she needs to reschedule. Advised if will be bleeding the day of her appointment will need to call to reschedule. Patient states that she usually only has light spotting for a day or so and does not feel this will interfere with the appointment. Will monitor and contact the office if she has bleeding leading up to appointment.  Routing to provider for final review. Patient agreeable to disposition. Will close encounter.

## 2017-11-06 NOTE — Telephone Encounter (Signed)
Patient has questions regarding her colpo procedure appointment.

## 2017-12-04 ENCOUNTER — Encounter: Payer: Self-pay | Admitting: Obstetrics and Gynecology

## 2017-12-04 ENCOUNTER — Ambulatory Visit: Payer: BLUE CROSS/BLUE SHIELD | Admitting: Obstetrics and Gynecology

## 2017-12-04 ENCOUNTER — Other Ambulatory Visit: Payer: Self-pay

## 2017-12-04 VITALS — BP 154/86 | HR 88 | Resp 16 | Ht 63.0 in | Wt 154.0 lb

## 2017-12-04 DIAGNOSIS — Z01812 Encounter for preprocedural laboratory examination: Secondary | ICD-10-CM | POA: Diagnosis not present

## 2017-12-04 DIAGNOSIS — N930 Postcoital and contact bleeding: Secondary | ICD-10-CM | POA: Diagnosis not present

## 2017-12-04 DIAGNOSIS — E221 Hyperprolactinemia: Secondary | ICD-10-CM

## 2017-12-04 LAB — POCT URINE PREGNANCY: PREG TEST UR: NEGATIVE

## 2017-12-04 NOTE — Progress Notes (Signed)
Subjective:     Patient ID: Karina Ramirez, female   DOB: 1971-07-01, 47 y.o.   MRN: [id number]  HPI  Pap History: 10/10/17 Pap and HR HPV negative; 07-13-16 Neg:neg HR HPV, 06-29-15 Neg:Neg HR HPV, 06/23/14 pap and HR HPV negative, 01/11/14 pap and HR HPV negative, 07/09/13 colpo biopsy showing low grade dysplasia only, 06/22/13 ASCUS:Pos HR HPV, 07/01/12 Negative, 05/18/11 Negative  Here for colposcopy for post coital bleeding and a prolactin recheck.  Prolactin already was drawn and she is fasting.   Testing for GC/CT/trich/BV negative on 10/24/17.  Testing positive for yeast on 10/24/17.  Prolactin level 32.5 on 10/10/17 and 31.1 on 10/24/17.   Menstrual cycle this month lasted for 2 days.  One month ago had menses for 5 days.  Now cycles are about 28 days apart.  Endometrial ablation was done in 2012 along with TVT and anterior colporrhaphy.   Review of Systems  LMP: 11/29/17 Contraception: Vasectomy/Ablation UPT: Negative  Colposcopy  Consent for procedure.  3% acetic acid placed.  Colposcopy satisfactory.  White light and green light filter used.  No lesions seen.  No biopsy needed.  No complications of procedure.  No EBL.    Objective:   Physical Exam  Genitourinary:         Assessment:     Postcoital bleeding.  Negative evaluation.  May be atrophy. Resumption of menses following endometrial ablation.  Elevated prolactin.  Negative brain MRI in past.     Plan:     FU prolactin level.  Discussed possible need for repeat MRI.  Return for irregular bleeding, heavy cycles, or painful periods. If post coital bleeding persists, will consider vaginal estrogen to treat potential atrophy.   After visit summary to patient.

## 2017-12-05 LAB — PROLACTIN: Prolactin: 29.6 ng/mL — ABNORMAL HIGH (ref 4.8–23.3)

## 2017-12-06 ENCOUNTER — Telehealth: Payer: Self-pay | Admitting: Obstetrics and Gynecology

## 2017-12-06 DIAGNOSIS — E221 Hyperprolactinemia: Secondary | ICD-10-CM

## 2017-12-06 NOTE — Telephone Encounter (Signed)
Ambulatory referral entered.

## 2017-12-06 NOTE — Telephone Encounter (Signed)
Phone call to patient regarding prolactin level which is still elevated.   We discussed options for care:  MRI versus referral to endocrinology.   Plan will be to refer to reproductive endocrinology.  [place] would [place] someone closer to home.  I suggested Dr. Lujean RaveYakcinkaya [facility].  Please make appointment for patient.

## 2017-12-09 NOTE — Telephone Encounter (Signed)
Routing to [employer] Davis for referral coordination. Information will need to be faxed to Dr.Yalcinkaya's office for scheduling.  Routing to provider for final review. Patient agreeable to disposition. Will close encounter.

## 2017-12-12 ENCOUNTER — Telehealth: Payer: Self-pay

## 2017-12-12 DIAGNOSIS — E221 Hyperprolactinemia: Secondary | ICD-10-CM

## 2017-12-12 IMAGING — MR MR THORACIC SPINE W/O CM
4 of 6 series · 22 of 48 positions shown · non-contrast
Comparison: None.

CLINICAL DATA: 46 y/o F; 1 year of right-sided upper and middle
posterior back pain increasing over the last 3 months.

EXAM:
MRI THORACIC SPINE WITHOUT CONTRAST
TECHNIQUE: Multiplanar, multisequence MR imaging of the thoracic spine was
performed. No intravenous contrast was administered.

[Series 17: STIR · sagittal · 3.3mm · 0.78mm/px · 4 of 13 slices shown]
[im 1/13]
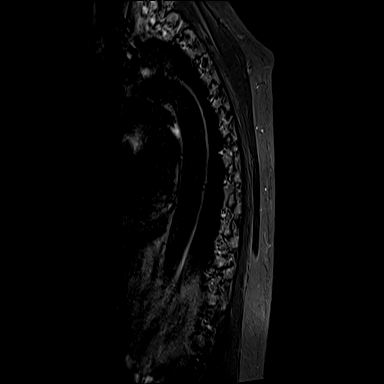
[im 4/13]
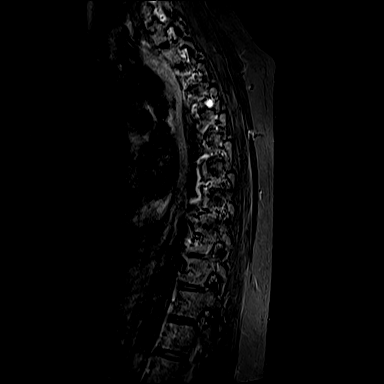
[im 7/13]
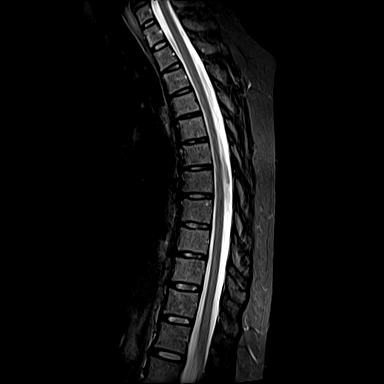
[im 13/13]
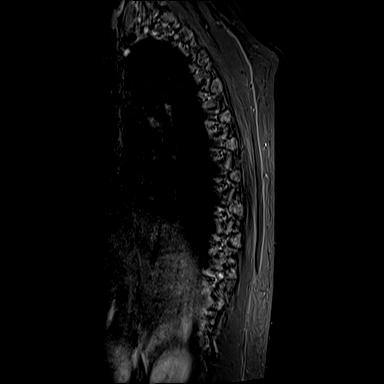

[Series 18: T2 · sagittal · 3.3mm · 0.78mm/px · 5 of 13 slices shown (1 of 2)]
[im 1/13]
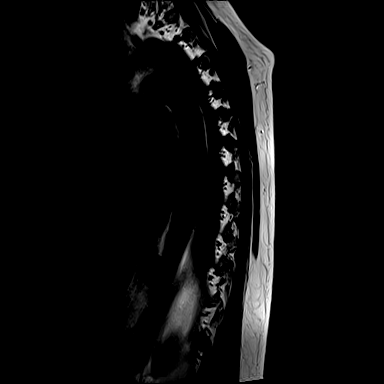
[im 4/13]
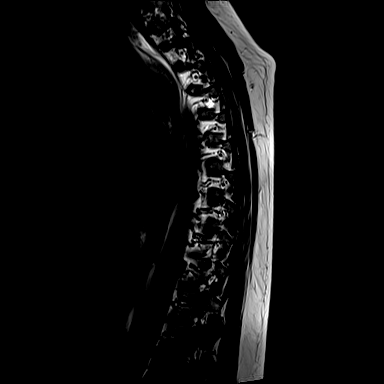
[im 7/13]
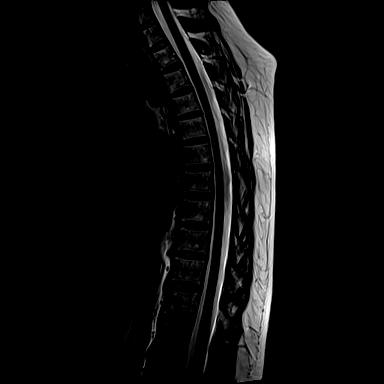
[im 10/13]
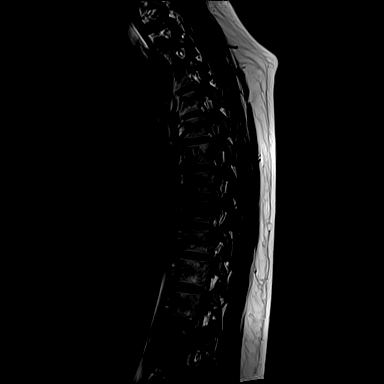
[im 13/13]
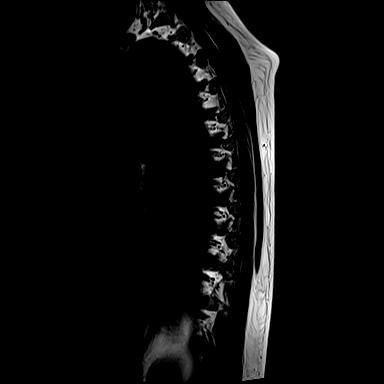

[Series 19: T1 · sagittal · 3.3mm · 0.78mm/px · 5 of 13 slices shown]
[im 1/13]
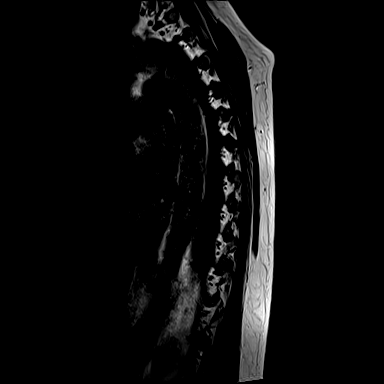
[im 4/13]
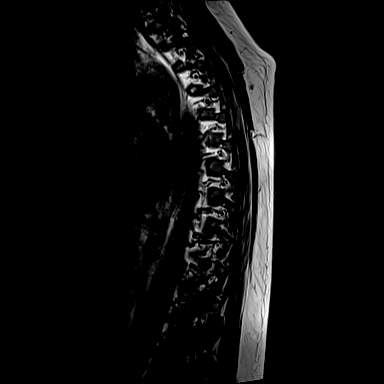
[im 7/13]
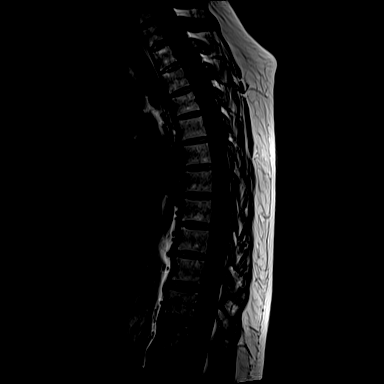
[im 10/13]
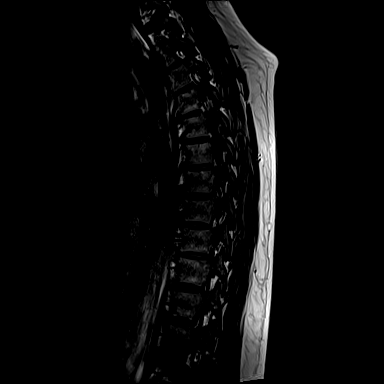
[im 13/13]
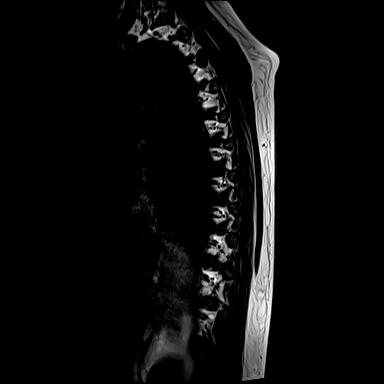

[Series 20: T2 · axial · 4.0mm · 0.28mm/px · z∈[-310,-82]mm · 8 of 39 slices shown (2 of 2)]
[im 1/39]
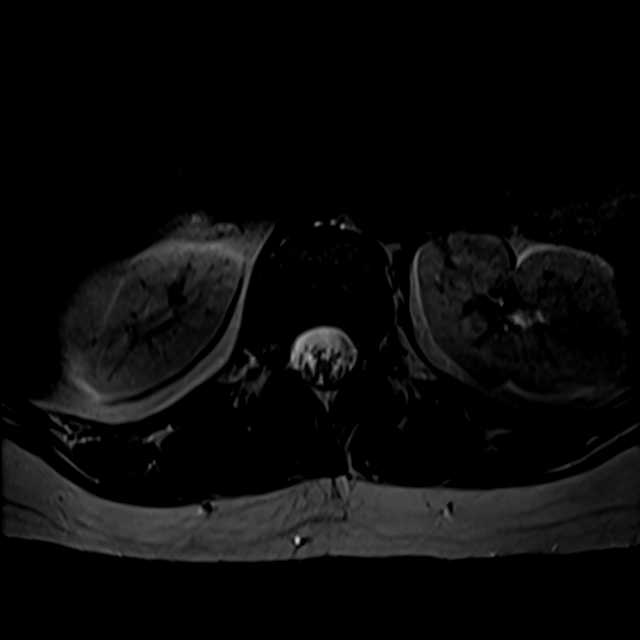
[im 6/39]
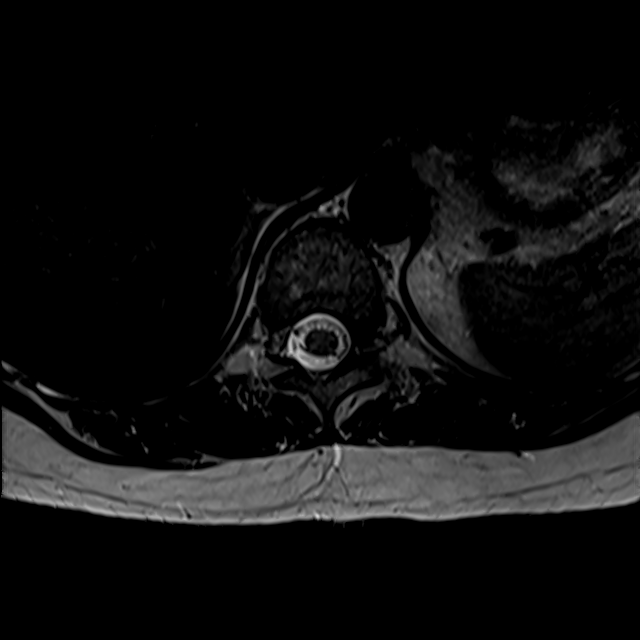
[im 12/39]
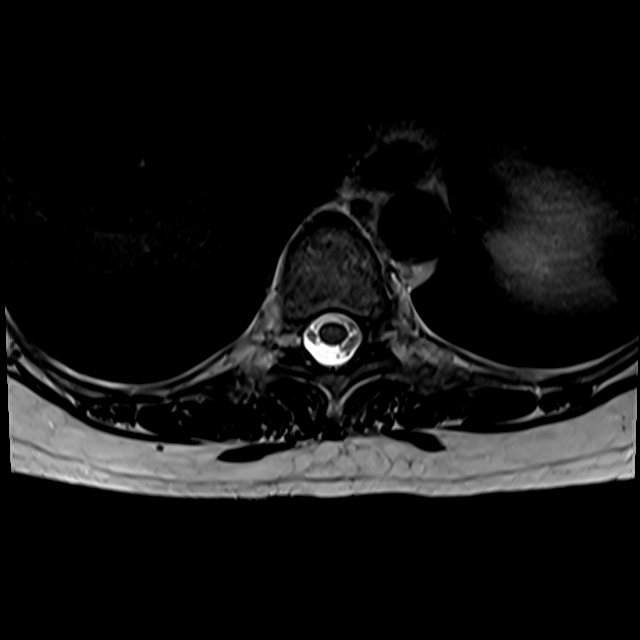
[im 18/39]
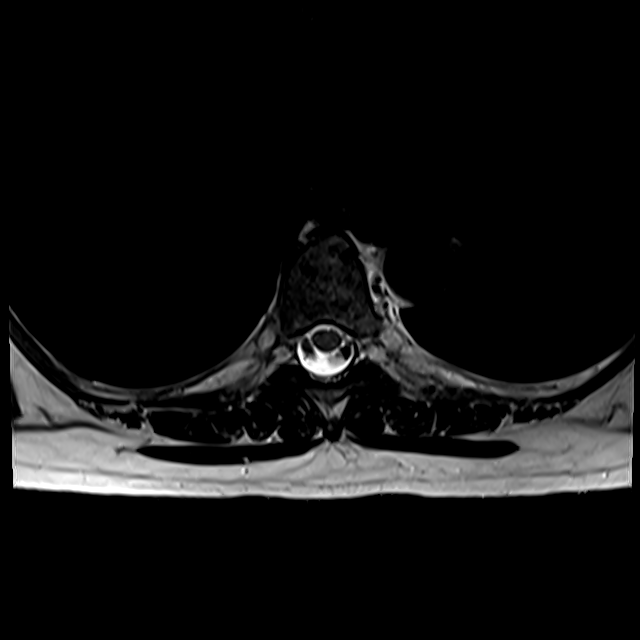
[im 21/39]
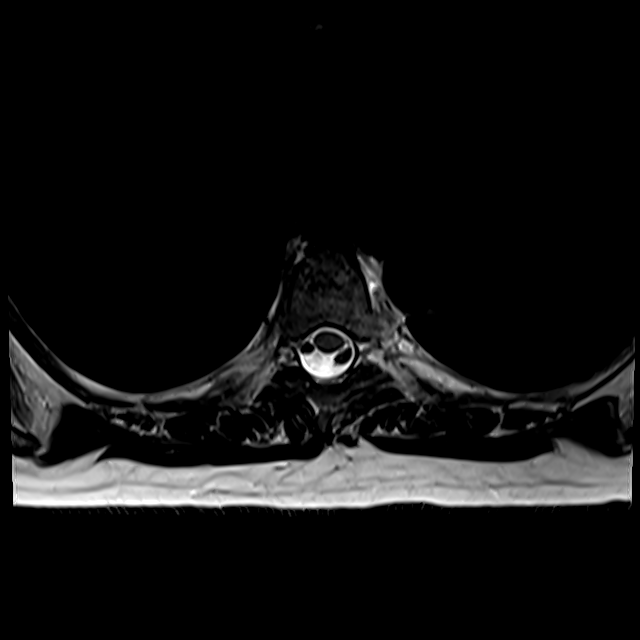
[im 27/39]
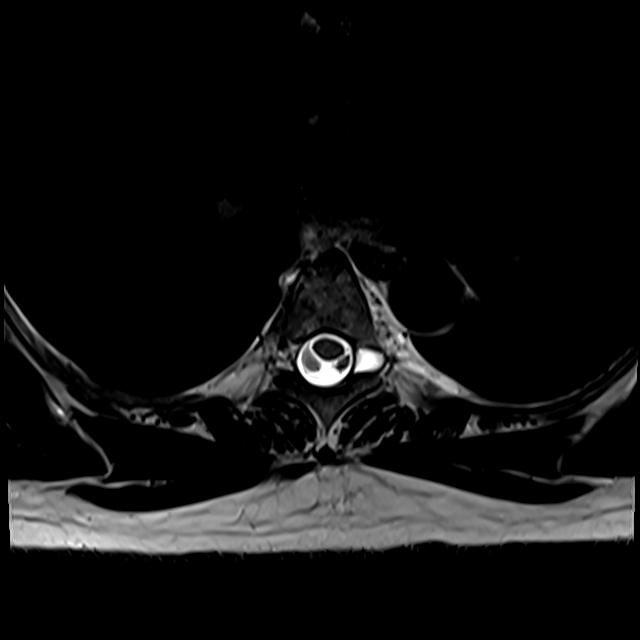
[im 33/39]
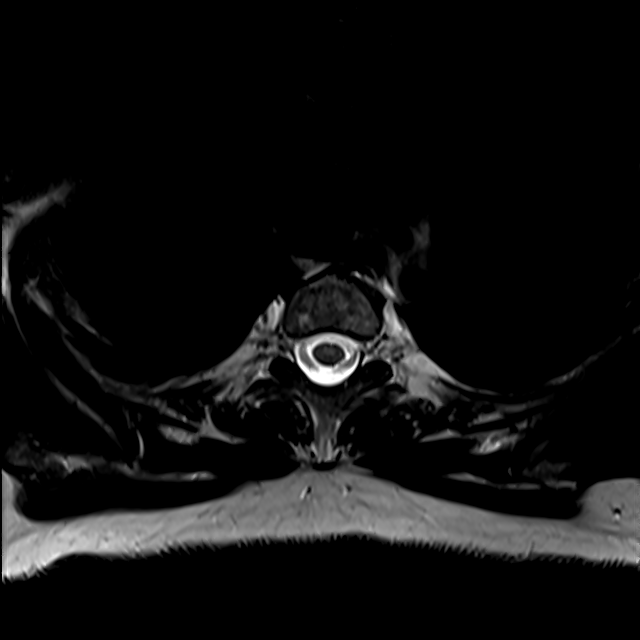
[im 39/39]
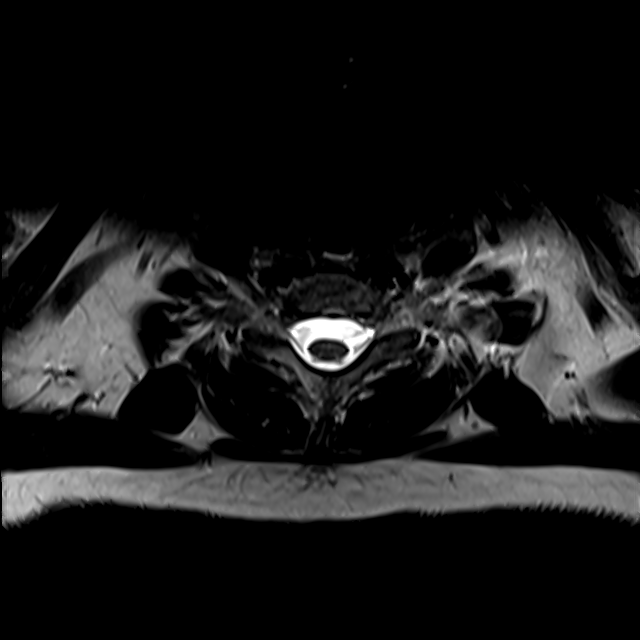

[22 of 48 positions shown; findings below may reference images not displayed]

FINDINGS: Alignment:  Physiologic.

Vertebrae: No fracture, evidence of discitis, or bone lesion.

Cord: Normal signal and morphology. 8 mm left T4-5 nerve root sheath
cyst.

Paraspinal and other soft tissues: Negative.

Disc levels:

No significant disc displacement, foraminal stenosis, or canal
stenosis.
IMPRESSION: No finding as explanation for pain. Unremarkable thoracic spine MRI.

By: Klpigbb Moolman M.D.

## 2017-12-12 NOTE — Telephone Encounter (Signed)
Karina Ramirez C Silva, Karina E, MD  Nuala Chiles, Karina HammanKaitlyn E, RN        Please send a referral to [facility].  Patient would like to have a consultation with an endocrinology specialist, which she has not done since her diagnosis of hyperprolactinemia many years ago.   Thank you,   Karina   Previous Messages    ----- Message -----  From: [name], [name], RN  Sent: 12/11/2017 11:38 AM  To: Karina Rosalin HawkingE [name], MD  Subject: [name]                Spoke with [name] who states they do not see patient's long term for Hyperprolactinemia. Would only see this patient for care if she desired IVF with a surrogate. Please advise.      Referral to [facility] placed. Encounter closed.

## 2017-12-19 ENCOUNTER — Telehealth: Payer: Self-pay | Admitting: Obstetrics and Gynecology

## 2017-12-19 NOTE — Telephone Encounter (Signed)
The referral was faxed on 12/12/17 to [facility]. I did have to re fax the referral today. They stated that they did not receive the fax or could not find it. I will follow up with them on Tuesday. I have the confirmation from the 11th and today that it was received. I will call the patient and update her.

## 2017-12-19 NOTE — Telephone Encounter (Signed)
Call returned to patient. Confirmed patient is f/u on referral for endocrinology regarding hyperprolactinemia. [place]. Advised referral has been placed, will forward to referral coordinator for f/u. Patient agreeable.    Rosa, can you f/u with patient referral to endocrinology?

## 2017-12-19 NOTE — Telephone Encounter (Signed)
Patient was seen a few weeks ago and is asking for the status of her neurology referral.

## 2017-12-26 ENCOUNTER — Other Ambulatory Visit: Payer: Self-pay

## 2017-12-26 DIAGNOSIS — E221 Hyperprolactinemia: Secondary | ICD-10-CM

## 2018-02-25 ENCOUNTER — Telehealth: Payer: Self-pay | Admitting: Obstetrics and Gynecology

## 2018-02-25 MED ORDER — FLUCONAZOLE 150 MG PO TABS: ORAL_TABLET | ORAL | 0 refills | Status: AC

## 2018-02-25 NOTE — Telephone Encounter (Signed)
Spoke with patient. Reports vaginal itching and "a little uncomfortable", started 1wk ago. Patient requesting RX for diflucan.   Denies vag d/c, odor, pain, urinary complaints or bleeding.  Recommended OV for further evaluation, patient declined. Recommended OTC monistat, patient declined. Patient states Rx for diflucan has been sent in the past and works well. Patient requesting Dr. Edward JollySilva review. Advised will review with Dr. Edward JollySilva and return call.  Dr. Edward JollySilva -please advise on diflucan Rx?

## 2018-02-25 NOTE — Telephone Encounter (Signed)
Ok for Diflucan 150 mg po x 1.  May repeat in 72 hours prn.  Disp - 2. RF - none.  Office visit if symptoms continue.

## 2018-02-25 NOTE — Telephone Encounter (Signed)
Patient thinks she has a yeast infection. Patient declined to come in for an appointment today. Patient states that Dr.Silva has called in diflucan in the past. Confirmed the pharmacy on file.

## 2018-02-25 NOTE — Telephone Encounter (Signed)
Spoke with patient, advised as seen below per Dr. Silva. Rx to verified pharmacy. Patient verbalizes understanding and is agreeable.   Encounter closed. 

## 2018-03-11 ENCOUNTER — Telehealth: Payer: Self-pay | Admitting: Obstetrics and Gynecology

## 2018-03-11 NOTE — Telephone Encounter (Signed)
The Culturelle daily or yogurt daily may help to reduce yeast infections.  She may select any type of yogurt.   They all contain bacteria that help to maintain a normal bacterial balance in the vaginal and intestinal tract.

## 2018-03-11 NOTE — Telephone Encounter (Signed)
Spoke with patient. Patient states yeast symptoms resolved with diflucan, is looking for natural options to keep yeast from reoccurring. Is taking Culturelle daily. Has looked at different types of yogurts and is unsure of what is best.  Advised patient if experiencing symptoms of yeast OV recommended for further evaluation, could be other factors causing reoccurring yeast.  Patient declines OV at this time, no symptoms.   Advised will review with Dr. Edward JollySilva and return call with recommendations, patient agreeable.   Dr. Edward JollySilva -please advise.

## 2018-03-11 NOTE — Telephone Encounter (Signed)
Spoke with patient, advised as seen below per Dr. Silva.  Patient verbalizes understanding and is agreeable.  Encounter closed.  

## 2018-03-11 NOTE — Telephone Encounter (Signed)
Patient wanting to find out what type of yogurt is good for yeast symptoms.

## 2018-03-17 ENCOUNTER — Ambulatory Visit: Payer: BLUE CROSS/BLUE SHIELD | Admitting: Internal Medicine

## 2018-07-03 ENCOUNTER — Ambulatory Visit: Payer: Self-pay | Admitting: Internal Medicine

## 2018-07-03 ENCOUNTER — Encounter

## 2018-07-03 DIAGNOSIS — Z0289 Encounter for other administrative examinations: Secondary | ICD-10-CM

## 2018-07-03 DIAGNOSIS — E221 Hyperprolactinemia: Secondary | ICD-10-CM | POA: Insufficient documentation

## 2018-07-03 NOTE — Progress Notes (Deleted)
  HPI  Karina Ramirez is a 47 y.o.-year-old female, referred by Dr. Conley Simmonds for evaluation for hyperprolactinemia.  Pt. was found to have a slightly high prolactin level in 2011.  She had a pituitary MRI (09/28/2009): Negative for pituitary adenoma   She is not on OCPs or HRT.  Her menstrual cycles are normal.  Pt denies: - Galactorrhea - Headaches - Vision disturbance - Weight gain - Acne - Hirsutism - Fatigue - Hot flushes  Reviewed prolactin levels: Lab Results  Component Value Date   PROLACTIN 29.6 (H) 12/04/2017   PROLACTIN 31.1 (H) 10/24/2017   PROLACTIN 32.5 (H) 10/10/2017   PROLACTIN 20.7 07/13/2016   PROLACTIN 22.6 06/29/2015   PROLACTIN 23.8 06/23/2014   PROLACTIN 22.6 04/30/2013   Patient's thyroid tests were normal: Lab Results  Component Value Date   TSH 3.210 10/24/2017   TSH 1.852 04/30/2013    She had normal estradiol and gonadotropins: Component     Latest Ref Rng & Units 04/30/2013 11/05/2016  FSH     mIU/mL 6.3 2.5  LH     mIU/mL 2.9   Estradiol     pg/mL 109.6 205   Pt. also has a history of ***.  ROS: Constitutional: + Please see HPI Eyes: no blurry vision, no xerophthalmia ENT: no sore throat, + nodules palpated in throat, no dysphagia/odynophagia, no hoarseness Cardiovascular: no CP/SOB/palpitations/leg swelling Respiratory: no cough/SOB Gastrointestinal: no N/V/D/C Musculoskeletal: no muscle/joint aches Skin: no rashes Neurological: no tremors/numbness/tingling/dizziness Psychiatric: no depression/anxiety  PE: There were no vitals taken for this visit. Wt Readings from Last 3 Encounters:  12/04/17 154 lb (69.9 kg)  10/24/17 158 lb (71.7 kg)  10/10/17 159 lb (72.1 kg)   Constitutional: overweight, in NAD Eyes: PERRLA, EOMI, no exophthalmos ENT: moist mucous membranes, no thyromegaly, no cervical lymphadenopathy Cardiovascular: RRR, No MRG Respiratory: CTA B Gastrointestinal: abdomen soft, NT, ND, BS+ Musculoskeletal: no  deformities, strength intact in all 4 Skin: moist, warm, no rashes Neurological: no tremor with outstretched hands, DTR normal in all 4  ASSESSMENT: 1.  Hyperprolactinemia  PLAN:  1.  Patient with persistent high prolactin levels, and without evidence of a pituitary tumor.  - I discussed with the patient about possible etiologies of high prolactin levels:  Pregnancy  Hypothyroidism   Stress   Exercise   Lack of sleep   Medications (she's not taking psychotropic medications or Reglan)  Drugs (denies)  Chest wall lesions (denies)  Seizures (denies)  Liver ds   Kidney disease   A teratoma containing pituitary cells that can develop into prolactinoma (very rarely)  Macroprolactin (multimeric prolactin, especially since patient does not have galactorrhea)  idiopathic - high prolactin most frequently impacts menstrual cycles - we will check monomeric and total prolactin today, along with all her TFTs - If prolactin is still elevated, we may need another MRI with pituitary cuts - we discussed the possibility of tx of her high PRL levels (Cabergoline, low dose) if the high PRL level is not caused by macroprolactin. She agrees with the plan. - RTC in 3 months   Carlus Pavlov, MD PhD [facility]

## 2018-08-15 ENCOUNTER — Encounter

## 2018-10-24 ENCOUNTER — Ambulatory Visit: Payer: BLUE CROSS/BLUE SHIELD | Admitting: Obstetrics and Gynecology

## 2018-10-31 ENCOUNTER — Ambulatory Visit: Payer: BLUE CROSS/BLUE SHIELD | Admitting: Obstetrics and Gynecology
